# Patient Record
Sex: Female | Born: 1935 | Hispanic: No | Marital: Single | Smoking: Current every day smoker
Health system: Southern US, Community
[De-identification: ages and names within clinical notes are randomized; demographics above are authoritative.]

## PROBLEM LIST (undated history)

## (undated) DIAGNOSIS — I10 Essential (primary) hypertension: Secondary | ICD-10-CM

## (undated) DIAGNOSIS — I639 Cerebral infarction, unspecified: Secondary | ICD-10-CM

## (undated) HISTORY — PX: ABDOMINAL HYSTERECTOMY: SHX81

---

## 2018-11-27 ENCOUNTER — Observation Stay (HOSPITAL_COMMUNITY): Payer: Medicare Other

## 2018-11-27 ENCOUNTER — Emergency Department (HOSPITAL_COMMUNITY): Payer: Medicare Other

## 2018-11-27 ENCOUNTER — Inpatient Hospital Stay (HOSPITAL_COMMUNITY)
Admission: EM | Admit: 2018-11-27 | Discharge: 2018-11-29 | DRG: 071 | Disposition: A | Discharge status: Home Health | Payer: Medicare Other | Attending: Internal Medicine | Admitting: Internal Medicine

## 2018-11-27 ENCOUNTER — Encounter (HOSPITAL_COMMUNITY): Payer: Self-pay

## 2018-11-27 ENCOUNTER — Other Ambulatory Visit: Payer: Self-pay

## 2018-11-27 DIAGNOSIS — I480 Paroxysmal atrial fibrillation: Secondary | ICD-10-CM | POA: Diagnosis present

## 2018-11-27 DIAGNOSIS — R4182 Altered mental status, unspecified: Secondary | ICD-10-CM | POA: Diagnosis not present

## 2018-11-27 DIAGNOSIS — N179 Acute kidney failure, unspecified: Secondary | ICD-10-CM

## 2018-11-27 DIAGNOSIS — E785 Hyperlipidemia, unspecified: Secondary | ICD-10-CM

## 2018-11-27 DIAGNOSIS — F0150 Vascular dementia without behavioral disturbance: Secondary | ICD-10-CM | POA: Diagnosis present

## 2018-11-27 DIAGNOSIS — Z79899 Other long term (current) drug therapy: Secondary | ICD-10-CM

## 2018-11-27 DIAGNOSIS — E876 Hypokalemia: Secondary | ICD-10-CM | POA: Diagnosis present

## 2018-11-27 DIAGNOSIS — N183 Chronic kidney disease, stage 3 (moderate): Secondary | ICD-10-CM | POA: Diagnosis present

## 2018-11-27 DIAGNOSIS — Z7901 Long term (current) use of anticoagulants: Secondary | ICD-10-CM

## 2018-11-27 DIAGNOSIS — Z90710 Acquired absence of both cervix and uterus: Secondary | ICD-10-CM

## 2018-11-27 DIAGNOSIS — F17210 Nicotine dependence, cigarettes, uncomplicated: Secondary | ICD-10-CM | POA: Diagnosis present

## 2018-11-27 DIAGNOSIS — G9340 Encephalopathy, unspecified: Secondary | ICD-10-CM | POA: Diagnosis not present

## 2018-11-27 DIAGNOSIS — Z1159 Encounter for screening for other viral diseases: Secondary | ICD-10-CM

## 2018-11-27 DIAGNOSIS — I129 Hypertensive chronic kidney disease with stage 1 through stage 4 chronic kidney disease, or unspecified chronic kidney disease: Secondary | ICD-10-CM | POA: Diagnosis present

## 2018-11-27 HISTORY — DX: Essential (primary) hypertension: I10

## 2018-11-27 HISTORY — DX: Cerebral infarction, unspecified: I63.9

## 2018-11-27 LAB — URINALYSIS, ROUTINE W REFLEX MICROSCOPIC
Bilirubin Urine: NEGATIVE
Glucose, UA: NEGATIVE mg/dL
Ketones, ur: NEGATIVE mg/dL
Nitrite: NEGATIVE
Protein, ur: 100 mg/dL — AB
Specific Gravity, Urine: 1.016 (ref 1.005–1.030)
pH: 6 (ref 5.0–8.0)

## 2018-11-27 LAB — CBC
HCT: 37.3 % (ref 36.0–46.0)
Hemoglobin: 12.1 g/dL (ref 12.0–15.0)
MCH: 31.8 pg (ref 26.0–34.0)
MCHC: 32.4 g/dL (ref 30.0–36.0)
MCV: 97.9 fL (ref 80.0–100.0)
Platelets: 213 10*3/uL (ref 150–400)
RBC: 3.81 MIL/uL — ABNORMAL LOW (ref 3.87–5.11)
RDW: 15.2 % (ref 11.5–15.5)
WBC: 7.1 10*3/uL (ref 4.0–10.5)
nRBC: 0 % (ref 0.0–0.2)

## 2018-11-27 LAB — COMPREHENSIVE METABOLIC PANEL
ALT: 33 U/L (ref 0–44)
AST: 44 U/L — ABNORMAL HIGH (ref 15–41)
Albumin: 4 g/dL (ref 3.5–5.0)
Alkaline Phosphatase: 76 U/L (ref 38–126)
Anion gap: 8 (ref 5–15)
BUN: 32 mg/dL — ABNORMAL HIGH (ref 8–23)
CO2: 22 mmol/L (ref 22–32)
Calcium: 9 mg/dL (ref 8.9–10.3)
Chloride: 113 mmol/L — ABNORMAL HIGH (ref 98–111)
Creatinine, Ser: 2.2 mg/dL — ABNORMAL HIGH (ref 0.44–1.00)
GFR calc Af Amer: 24 mL/min — ABNORMAL LOW (ref >60)
GFR calc non Af Amer: 20 mL/min — ABNORMAL LOW (ref >60)
Glucose, Bld: 103 mg/dL — ABNORMAL HIGH (ref 70–99)
Potassium: 3.5 mmol/L (ref 3.5–5.1)
Sodium: 143 mmol/L (ref 135–145)
Total Bilirubin: 0.6 mg/dL (ref 0.3–1.2)
Total Protein: 8.1 g/dL (ref 6.5–8.1)

## 2018-11-27 LAB — SARS CORONAVIRUS 2 BY RT PCR (HOSPITAL ORDER, PERFORMED IN ~~LOC~~ HOSPITAL LAB): SARS Coronavirus 2: NEGATIVE

## 2018-11-27 LAB — PROTIME-INR
INR: 2.5 — ABNORMAL HIGH (ref 0.8–1.2)
Prothrombin Time: 26.9 seconds — ABNORMAL HIGH (ref 11.4–15.2)

## 2018-11-27 LAB — CBG MONITORING, ED: Glucose-Capillary: 87 mg/dL (ref 70–99)

## 2018-11-27 MED ORDER — SODIUM CHLORIDE 0.9% FLUSH: 3.0000 mL | Freq: Once | INTRAVENOUS | Status: DC

## 2018-11-27 MED ORDER — ONDANSETRON HCL 4 MG PO TABS: 4.0000 mg | ORAL_TABLET | Freq: Four times a day (QID) | ORAL | Status: DC | PRN

## 2018-11-27 MED ORDER — ROSUVASTATIN CALCIUM 20 MG PO TABS
40.0000 mg | ORAL_TABLET | Freq: Every day | ORAL | Status: DC
  Administered 2018-11-28 – 2018-11-29 (×2): 40 mg via ORAL
  Filled 2018-11-27 (×2): qty 2

## 2018-11-27 MED ORDER — ACETAMINOPHEN 650 MG RE SUPP: 650.0000 mg | Freq: Four times a day (QID) | RECTAL | Status: DC | PRN

## 2018-11-27 MED ORDER — SODIUM CHLORIDE 0.9 % IV SOLN
1.0000 g | INTRAVENOUS | Status: DC
  Administered 2018-11-27: 1 g via INTRAVENOUS
  Filled 2018-11-27: qty 10

## 2018-11-27 MED ORDER — SODIUM CHLORIDE 0.9 % IV SOLN
INTRAVENOUS | Status: DC
  Administered 2018-11-28 (×2): via INTRAVENOUS

## 2018-11-27 MED ORDER — ONDANSETRON HCL 4 MG/2ML IJ SOLN: 4.0000 mg | Freq: Four times a day (QID) | INTRAMUSCULAR | Status: DC | PRN

## 2018-11-27 MED ORDER — SODIUM CHLORIDE 0.9 % IV SOLN
1.0000 g | INTRAVENOUS | Status: DC
  Administered 2018-11-28: 1 g via INTRAVENOUS
  Filled 2018-11-27 (×2): qty 10

## 2018-11-27 MED ORDER — ACETAMINOPHEN 325 MG PO TABS: 650.0000 mg | ORAL_TABLET | Freq: Four times a day (QID) | ORAL | Status: DC | PRN

## 2018-11-27 NOTE — ED Triage Notes (Signed)
Pt brought in by daughter. Per daughter, mother has been more confused and hallucinating. Daughter describes this increasing the last 1-2 days. Pt is alert and oriented x 4 in triage. Pt states that she is here for a "neuro check". Daughter states pt has hx of TIAs. PCP told family to bring pt in for eval.

## 2018-11-27 NOTE — ED Notes (Signed)
ED TO INPATIENT HANDOFF REPORT  Name/Age/Gender Michelle Walton 84 y.o. female  Code Status   Home/SNF/Other Home  Chief Complaint ALTERED MENTAL STATUS  Level of Care/Admitting Diagnosis ED Disposition    ED Disposition Condition Comment   Admit  Hospital Area: [facility] [[id number]]  Level of Care: Telemetry [5]  Admit to tele based on following criteria: Other see comments  Comments: ?Stroke  Covid Evaluation: Screening Protocol (No Symptoms)  Diagnosis: Acute encephalopathy [462194]  Admitting Physician: Noralee Stain [[id number]]  Attending Physician: Noralee Stain [id number]  PT Class (Do Not Modify): Observation [104]  PT Acc Code (Do Not Modify): Observation [10022]       Medical History Past Medical History:  Diagnosis Date  . Hypertension   . Stroke [facility])     Allergies No Known Allergies  IV Location/Drains/Wounds Patient Lines/Drains/Airways Status   Active Line/Drains/Airways    Name:   Placement date:   Placement time:   Site:   Days:   Peripheral IV 11/27/18 Right Antecubital   11/27/18    1502    Antecubital   less than 1          Labs/Imaging Results for orders placed or performed during the hospital encounter of 11/27/18 (from the past 48 hour(s))  CBG monitoring, ED     Status: None   Collection Time: 11/27/18 12:57 PM  Result Value Ref Range   Glucose-Capillary 87 70 - 99 mg/dL  Urinalysis, Routine w reflex microscopic     Status: Abnormal   Collection Time: 11/27/18  1:53 PM  Result Value Ref Range   Color, Urine YELLOW YELLOW   APPearance HAZY (A) CLEAR   Specific Gravity, Urine 1.016 1.005 - 1.030   pH 6.0 5.0 - 8.0   Glucose, UA NEGATIVE NEGATIVE mg/dL   Hgb urine dipstick MODERATE (A) NEGATIVE   Bilirubin Urine NEGATIVE NEGATIVE   Ketones, ur NEGATIVE NEGATIVE mg/dL   Protein, ur 301 (A) NEGATIVE mg/dL   Nitrite NEGATIVE NEGATIVE   Leukocytes,Ua MODERATE (A) NEGATIVE   RBC / HPF 6-10 0 - 5 RBC/hpf   WBC, UA  21-50 0 - 5 WBC/hpf   Bacteria, UA MANY (A) NONE SEEN   Squamous Epithelial / LPF 0-5 0 - 5   Mucus PRESENT    Hyaline Casts, UA PRESENT    Granular Casts, UA PRESENT     Comment: Performed at [facility], [address], [place], [place] [address]  Comprehensive metabolic panel     Status: Abnormal   Collection Time: 11/27/18  2:04 PM  Result Value Ref Range   Sodium 143 135 - 145 mmol/L   Potassium 3.5 3.5 - 5.1 mmol/L   Chloride 113 (H) 98 - 111 mmol/L   CO2 22 22 - 32 mmol/L   Glucose, Bld 103 (H) 70 - 99 mg/dL   BUN 32 (H) 8 - 23 mg/dL   Creatinine, Ser 2.49 (H) 0.44 - 1.00 mg/dL   Calcium 9.0 8.9 - 32.4 mg/dL   Total Protein 8.1 6.5 - 8.1 g/dL   Albumin 4.0 3.5 - 5.0 g/dL   AST 44 (H) 15 - 41 U/L   ALT 33 0 - 44 U/L   Alkaline Phosphatase 76 38 - 126 U/L   Total Bilirubin 0.6 0.3 - 1.2 mg/dL   GFR calc non Af Amer 20 (L) >60 mL/min   GFR calc Af Amer 24 (L) >60 mL/min   Anion gap 8 5 - 15  Comment: Performed at [facility], [address] [place], [place] [address]  CBC     Status: Abnormal   Collection Time: 11/27/18  2:04 PM  Result Value Ref Range   WBC 7.1 4.0 - 10.5 K/uL   RBC 3.81 (L) 3.87 - 5.11 MIL/uL   Hemoglobin 12.1 12.0 - 15.0 g/dL   HCT 19.137.3 47.836.0 - 29.546.0 %   MCV 97.9 80.0 - 100.0 fL   MCH 31.8 26.0 - 34.0 pg   MCHC 32.4 30.0 - 36.0 g/dL   RDW 62.115.2 30.811.5 - 65.715.5 %   Platelets 213 150 - 400 K/uL   nRBC 0.0 0.0 - 0.2 %    Comment: Performed at [facility], [address] [place], [place] [address]  Protime-INR     Status: Abnormal   Collection Time: 11/27/18  3:39 PM  Result Value Ref Range   Prothrombin Time 26.9 (H) 11.4 - 15.2 seconds   INR 2.5 (H) 0.8 - 1.2    Comment: (NOTE) INR goal varies based on device and disease states. Performed at [facility], [address] [place], [place] [address]    Ct Head Wo Contrast  Addendum Date: 11/27/2018   ADDENDUM REPORT:  11/27/2018 14:07 ADDENDUM: These results were called by telephone at the time of interpretation on 11/27/2018 at 2:07 pm to Dr. Lorre NickANTHONY ALLEN , who verbally acknowledged these results. Electronically Signed   By: Bretta BangWilliam  Woodruff III M.D.   On: 11/27/2018 14:07   Result Date: 11/27/2018 CLINICAL DATA:  Confusion with hallucinations EXAM: CT HEAD WITHOUT CONTRAST TECHNIQUE: Contiguous axial images were obtained from the base of the skull through the vertex without intravenous contrast. COMPARISON:  None. FINDINGS: Brain: There is moderate diffuse atrophy. There is no intracranial mass, hemorrhage, extra-axial fluid collection, or midline shift. There is mild calcification along the anterior falx, benign in appearance. There is evidence of a prior mid left frontal infarct. There is extensive small vessel disease throughout the centra semiovale bilaterally. No acute infarct is demonstrable on this study. Vascular: There is increased attenuation in the left middle cerebral artery compared to its counterpart on the right. This finding may be indicative of the earliest changes of hyperdense vessel. Elsewhere there is extensive calcification in the distal vertebral arteries and carotid siphon regions bilaterally. Skull: The bony calvarium appears intact. Sinuses/Orbits: There is mucosal thickening in several ethmoid air cells. Other visualized paranasal sinuses are clear. Visualized orbits appear symmetric bilaterally. Other: Mastoid air cells are clear. IMPRESSION: Moderate diffuse atrophy with extensive periventricular small vessel disease bilaterally. Prior infarct mid left frontal lobe. No acute infarct is evident in the brain parenchyma. Note that there is increased attenuation in the left middle cerebral artery compared to the right. This is a finding that raises concern for potential early changes of acute middle cerebral artery distribution infarct on the left. Elsewhere, there is extensive arterial vascular  calcification in the distal vertebral artery and carotid siphon regions bilaterally. There is mucosal thickening in several ethmoid air cells. Electronically Signed: By: Bretta BangWilliam  Woodruff III M.D. On: 11/27/2018 13:50    Pending Labs Unresulted Labs (From admission, onward)    Start     Ordered   11/27/18 1540  SARS Coronavirus 2 (CEPHEID - Performed in [facility] hospital lab), [facility] Order  (Asymptomatic Patients Labs)  Once,   R    Question:  Rule Out  Answer:  Yes   11/27/18 1539   11/27/18 1327  Urine Culture  ONCE - STAT,   STAT     11/27/18 1326   Signed and Held  Protime-INR  Tomorrow morning,   R     Signed and Held   Signed and Held  CBC  Tomorrow morning,   R     Signed and Held   Signed and Held  Basic metabolic panel  Tomorrow morning,   R     Signed and Held          Vitals/Pain Today's Vitals   11/27/18 1530 11/27/18 1600 11/27/18 1630 11/27/18 1700  BP: (!) 171/82 (!) 167/74 (!) 151/73 (!) 155/78  Pulse: 80 76 72 78  Resp: (!) Temp:      TempSrc:      SpO2: 100% 100% 100% 100%  Weight:      Height:      PainSc:        Isolation Precautions No active isolations  Medications Medications  cefTRIAXone (ROCEPHIN) 1 g in sodium chloride 0.9 % 100 mL IVPB (0 g Intravenous Stopped 11/27/18 1540)    Mobility walks

## 2018-11-27 NOTE — ED Notes (Signed)
Patient transported to CT 

## 2018-11-27 NOTE — H&P (Signed)
History and Physical    Michelle Walton ZOX:[id number] DOB: 21-Jul-1934 DOA: 11/27/2018  PCP: System, Pcp Not In  Patient coming from: Home  Chief Complaint: Altered mental status   HPI: Michelle Walton is a 84 y.o. female with medical history significant of previous stroke, HTN, HLD who is visiting [place] from [place] for the past 2-3 weeks. She presents to the hospital with concern for altered mental status.  Apparently per report, daughter felt that patient was acting differently from her baseline.  Patient has some history of dementia and short-term memory loss at baseline but has been more confused in the past 2 days.  Per daughter, patient's baseline is that she is forgetful with her words and vocabulary. Now, she is not really aware of her surroundings, hallucinating, forgetting simple tasks, appears to be foggy. Patient does not have any complaints on examination.  She denies any fevers, cough, shortness of breath, chest pain, abdominal pain, nausea, vomiting or diarrhea.  She is somewhat confused, not oriented to place but could be that she was disoriented as she is not from this area.  She is oriented to year and herself.  She states that she has fallen recently as well.  ED Course: Labs obtained creatinine 2.2, INR 2.5. CT head without contrast revealed moderate diffuse atrophy, no acute infarct, although there was some increased attenuation in the left middle cerebral artery compared to the right with concern for potential early change in the acute MCA distribution infarct on the left.  Case discussed with neurology who recommended MRI for further evaluation.  Review of Systems: As per HPI otherwise 10 point review of systems negative.   Past Medical History:  Diagnosis Date   Hypertension    Stroke [facility])     Past Surgical History:  Procedure Laterality Date   ABDOMINAL HYSTERECTOMY       reports that she has been smoking. She has been smoking about 0.50 packs per day. She has  never used smokeless tobacco. She reports current alcohol use. She reports that she does not use drugs.  No Known Allergies  Family History  Family history unknown: Yes     Prior to Admission medications   Medication Sig Start Date End Date Taking? Authorizing Provider  Omega-3 Fatty Acids (FISH OIL) 1000 MG CAPS Take 1,000 mg by mouth daily.   Yes [provider]  quinapril (ACCUPRIL) 10 MG tablet Take 10 mg by mouth daily.   Yes [provider]  rosuvastatin (CRESTOR) 40 MG tablet Take 40 mg by mouth daily.   Yes [provider]  warfarin (COUMADIN) 3 MG tablet Take 3 mg by mouth daily.   Yes [provider]    Physical Exam: Vitals:   11/27/18 1241 11/27/18 1244 11/27/18 1430  BP:  128/85 (!) 150/69  Pulse:  90 74  Resp:  18 18  Temp:  98.5 F (36.9 C)   TempSrc:  Oral   SpO2:  96% 100%  Weight: 49.9 kg    Height:  (1.549 m)       Constitutional: NAD, calm, comfortable Eyes: PERRL, lids and conjunctivae normal ENMT: Mucous membranes are moist. Posterior pharynx clear of any exudate or lesions.Normal dentition.  Neck: normal, supple, no masses, no thyromegaly Respiratory: clear to auscultation bilaterally, no wheezing, no crackles. Normal respiratory effort. No accessory muscle use.  Cardiovascular: Regular rate and rhythm, no murmurs / rubs / gallops. No extremity edema.  Abdomen: no tenderness, no masses palpated. No hepatosplenomegaly. Bowel sounds  positive.  Musculoskeletal: no clubbing / cyanosis. No joint deformity upper and lower extremities. Good ROM, no contractures. Normal muscle tone.  Skin: no rashes, lesions, ulcers. No induration Neurologic: CN 2-12 grossly intact. Strength 5/5 in all 4. No pronator drift. Speech clear.  Psychiatric: Normal judgment and insight. Alert and oriented x 2. Normal mood. Seems slightly confused, word-finding difficulty.   Labs on Admission: I have personally reviewed following labs and  imaging studies  CBC: Recent Labs  Lab 11/27/18 1404  WBC 7.1  HGB 12.1  HCT 37.3  MCV 97.9  PLT 213   Basic Metabolic Panel: Recent Labs  Lab 11/27/18 1404  NA 143  K 3.5  CL 113*  CO2 22  GLUCOSE 103*  BUN 32*  CREATININE 2.20*  CALCIUM 9.0   GFR: Estimated Creatinine Clearance: 15.1 mL/min (A) (by C-G formula based on SCr of 2.2 mg/dL (H)). Liver Function Tests: Recent Labs  Lab 11/27/18 1404  AST 44*  ALT 33  ALKPHOS 76  BILITOT 0.6  PROT 8.1  ALBUMIN 4.0   No results for input(s): LIPASE, AMYLASE in the last 168 hours. No results for input(s): AMMONIA in the last 168 hours. Coagulation Profile: Recent Labs  Lab 11/27/18 1539  INR 2.5*   Cardiac Enzymes: No results for input(s): CKTOTAL, CKMB, CKMBINDEX, TROPONINI in the last 168 hours. BNP (last 3 results) No results for input(s): PROBNP in the last 8760 hours. HbA1C: No results for input(s): HGBA1C in the last 72 hours. CBG: Recent Labs  Lab 11/27/18 1257  GLUCAP 87   Lipid Profile: No results for input(s): CHOL, HDL, LDLCALC, TRIG, CHOLHDL, LDLDIRECT in the last 72 hours. Thyroid Function Tests: No results for input(s): TSH, T4TOTAL, FREET4, T3FREE, THYROIDAB in the last 72 hours. Anemia Panel: No results for input(s): VITAMINB12, FOLATE, FERRITIN, TIBC, IRON, RETICCTPCT in the last 72 hours. Urine analysis:    Component Value Date/Time   COLORURINE YELLOW 11/27/2018 1353   APPEARANCEUR HAZY (A) 11/27/2018 1353   LABSPEC 1.016 11/27/2018 1353   PHURINE 6.0 11/27/2018 1353   GLUCOSEU NEGATIVE 11/27/2018 1353   HGBUR MODERATE (A) 11/27/2018 1353   BILIRUBINUR NEGATIVE 11/27/2018 1353   KETONESUR NEGATIVE 11/27/2018 1353   PROTEINUR 100 (A) 11/27/2018 1353   NITRITE NEGATIVE 11/27/2018 1353   LEUKOCYTESUR MODERATE (A) 11/27/2018 1353   Sepsis Labs: !!!!!!!!!!!!!!!!!!!!!!!!!!!!!!!!!!!!!!!!!!!! @LABRCNTIP (procalcitonin:4,lacticidven:4) )No results found for this or any previous visit  (from the past 240 hour(s)).   Radiological Exams on Admission: Ct Head Wo Contrast  Addendum Date: 11/27/2018   ADDENDUM REPORT: 11/27/2018 14:07 ADDENDUM: These results were called by telephone at the time of interpretation on 11/27/2018 at 2:07 pm to Dr. Lorre NickANTHONY ALLEN , who verbally acknowledged these results. Electronically Signed   By: Bretta BangWilliam  Woodruff III M.D.   On: 11/27/2018 14:07   Result Date: 11/27/2018 CLINICAL DATA:  Confusion with hallucinations EXAM: CT HEAD WITHOUT CONTRAST TECHNIQUE: Contiguous axial images were obtained from the base of the skull through the vertex without intravenous contrast. COMPARISON:  None. FINDINGS: Brain: There is moderate diffuse atrophy. There is no intracranial mass, hemorrhage, extra-axial fluid collection, or midline shift. There is mild calcification along the anterior falx, benign in appearance. There is evidence of a prior mid left frontal infarct. There is extensive small vessel disease throughout the centra semiovale bilaterally. No acute infarct is demonstrable on this study. Vascular: There is increased attenuation in the left middle cerebral artery compared to its counterpart on the right. This finding may be indicative  of the earliest changes of hyperdense vessel. Elsewhere there is extensive calcification in the distal vertebral arteries and carotid siphon regions bilaterally. Skull: The bony calvarium appears intact. Sinuses/Orbits: There is mucosal thickening in several ethmoid air cells. Other visualized paranasal sinuses are clear. Visualized orbits appear symmetric bilaterally. Other: Mastoid air cells are clear. IMPRESSION: Moderate diffuse atrophy with extensive periventricular small vessel disease bilaterally. Prior infarct mid left frontal lobe. No acute infarct is evident in the brain parenchyma. Note that there is increased attenuation in the left middle cerebral artery compared to the right. This is a finding that raises concern for potential  early changes of acute middle cerebral artery distribution infarct on the left. Elsewhere, there is extensive arterial vascular calcification in the distal vertebral artery and carotid siphon regions bilaterally. There is mucosal thickening in several ethmoid air cells. Electronically Signed: By: Bretta Bang III M.D. On: 11/27/2018 13:50    EKG: Independently reviewed. Normal sinus rhythm rate 88  Assessment/Plan Principal Problem:   Acute encephalopathy Active Problems:   AKI (acute kidney injury) (HCC)   Hyperlipemia   On anticoagulant therapy   Acute encephalopathy, unclear etiology -Possible stroke, CT head revealed increased attenuation in left MCA compared to the right.  MRI brain ordered -Possibly due to UTI, present on admission. Patient has no symptoms of UTI, no dysuria.  UA revealed moderate leukocytes with many bacteria.  Urine culture is pending.  She was started on empiric Rocephin. -Patient does have some dementia and short-term memory loss at baseline  AKI versus CKD -No previous blood work on file, family does not know of any underlying kidney disease  -Presenting creatinine 2.2, will give IV hydration and hold lisinopril and monitor BMP -IVF   Hyperlipidemia -Continue Crestor  Anticoagulation -On coumadin, unclear why she is on this, family thinks it was started after a stroke and patient has been on this for many years   DVT prophylaxis: Coumadin Code Status: Full, discussed with daughter over the phone. Patient has 2 daughters who are next of kin.   Family Communication: Spoke with daughter over the phone Disposition Plan: Pending work up  [place] called: Neurology over the phone   Admission status: Observation   Severity of Illness: The appropriate patient status for this patient is OBSERVATION. Observation status is judged to be reasonable and necessary in order to provide the required intensity of service to ensure the patient's safety. The  patient's presenting symptoms, physical exam findings, and initial radiographic and laboratory data in the context of their medical condition is felt to place them at decreased risk for further clinical deterioration. Furthermore, it is anticipated that the patient will be medically stable for discharge from the hospital within 2 midnights of admission.   Noralee Stain, DO [facility] 11/27/2018, 4:40 PM    How to contact the [facility] Attending or Consulting provider 7A - 7P or covering provider during after hours 7P -7A, for this patient?  1. Check the care team in [facility] and look for a) attending/consulting [facility] provider listed and b) the [facility] team listed 2. Log into [email or web address] and use [place]'s universal password to access. If you do not have the password, please contact the hospital operator. 3. Locate the [facility] provider you are looking for under [facility] and page to a number that you can be directly reached. 4. If you still have difficulty reaching the provider, please page the [facility] (Director on Call) for the Hospitalists listed on amion for assistance.

## 2018-11-27 NOTE — Progress Notes (Signed)
ANTICOAGULATION CONSULT NOTE - Initial Consult  Pharmacy Consult for warfarin Indication: hx CVA  No Known Allergies  Patient Measurements: Height: 5\' 1"  (154.9 cm) Weight: 110 lb (49.9 kg) IBW/kg (Calculated) : 47.8  Vital Signs: Temp: 98.5 F (36.9 C) (06/05 1244) Temp Source: Oral (06/05 1244) BP: 155/78 (06/05 1700) Pulse Rate: 78 (06/05 1700)  Labs: Recent Labs    11/27/18 1404 11/27/18 1539  HGB 12.1  --   HCT 37.3  --   PLT 213  --   LABPROT  --  26.9*  INR  --  2.5*  CREATININE 2.20*  --     Estimated Creatinine Clearance: 15.1 mL/min (A) (by C-G formula based on SCr of 2.2 mg/dL (H)).   Medical History: Past Medical History:  Diagnosis Date  . Hypertension   . Stroke [facility])    Assessment: Patient admitted with AMS, acute encephalopathy and concern for stroke. Pt is PMH significant for CVA, HTN, HLD. She resides in [place] and is visiting the area so limited access to medical records to confirm warfarin dosing. Per MD note, patients family reports that she has been taking warfarin for many years and think it was started after a previous stroke.   Home dose: warfarin 3 mg PO daily Per med rec, patient reports last dose was 11/27/18 @ 0600  INR on admission: 2.5 - therapeutic  Today, 11/27/18  Hgb 12.1  Plt 213  INR 2.5 - therapeutic  Goal of Therapy:  INR 2-3 Monitor platelets by anticoagulation protocol: Yes   Plan:  Pt already took warfarin today. INR therapeutic. No dose ordered. INR/CBC with AM labs tomorrow  Cindi Carbon, PharmD 11/27/2018,5:14 PM

## 2018-11-27 NOTE — ED Notes (Signed)
Pt ambulatory to bathroom

## 2018-11-27 NOTE — ED Provider Notes (Addendum)
[facility]-EMERGENCY DEPT Provider Note   CSN: [id number] Arrival date & time: 11/27/18  1227    History   Chief Complaint Chief Complaint  Patient presents with  . Altered Mental Status    HPI Michelle Walton is a 84 y.o. female.     84 year old female presents with concern for altered mental status.  Patient was seen by her daughter who feels that she is acting differently.  The patient sounds denies any new somatic complaints.  No headaches, weakness in upper or lower extremities.  No recent fevers.  No cough or congestion.  No recent medication changes.  Denies any urinary symptoms.  Her physician was called and daughter instructed bring her here for further evaluation.     Past Medical History:  Diagnosis Date  . Hypertension   . Stroke [facility])     There are no active problems to display for this patient.   Past Surgical History:  Procedure Laterality Date  . ABDOMINAL HYSTERECTOMY       OB History   No obstetric history on file.      Home Medications    Prior to Admission medications   Not on File    Family History No family history on file.  Social History Social History   Tobacco Use  . Smoking status: Current Every Day Smoker    Packs/day: 0.50  . Smokeless tobacco: Never Used  Substance Use Topics  . Alcohol use: Yes    Comment: socially  . Drug use: Never     Allergies   Patient has no known allergies.   Review of Systems Review of Systems  All other systems reviewed and are negative.    Physical Exam Updated Vital Signs BP 128/85 (BP Location: Left Arm)   Pulse 90   Temp 98.5 F (36.9 C) (Oral)   Resp 18   Ht 1.549 m (5\' 1" )   Wt 49.9 kg   SpO2 96%   BMI 20.78 kg/m   Physical Exam Vitals signs and nursing note reviewed.  Constitutional:      General: She is not in acute distress.    Appearance: Normal appearance. She is well-developed. She is not toxic-appearing.  HENT:     Head: Normocephalic  and atraumatic.  Eyes:     General: Lids are normal.     Conjunctiva/sclera: Conjunctivae normal.     Pupils: Pupils are equal, round, and reactive to light.  Neck:     Musculoskeletal: Normal range of motion and neck supple.     Thyroid: No thyroid mass.     Trachea: No tracheal deviation.  Cardiovascular:     Rate and Rhythm: Normal rate and regular rhythm.     Heart sounds: Normal heart sounds. No murmur. No gallop.   Pulmonary:     Effort: Pulmonary effort is normal. No respiratory distress.     Breath sounds: Normal breath sounds. No stridor. No decreased breath sounds, wheezing, rhonchi or rales.  Abdominal:     General: Bowel sounds are normal. There is no distension.     Palpations: Abdomen is soft.     Tenderness: There is no abdominal tenderness. There is no rebound.  Musculoskeletal: Normal range of motion.        General: No tenderness.  Skin:    General: Skin is warm and dry.     Findings: No abrasion or rash.  Neurological:     Mental Status: She is alert and oriented to person, place, and  time.     GCS: GCS eye subscore is 4. GCS verbal subscore is 5. GCS motor subscore is 6.     Cranial Nerves: No cranial nerve deficit.     Sensory: No sensory deficit.     Motor: No weakness or tremor.     Coordination: Finger-Nose-Finger Test normal.  Psychiatric:        Mood and Affect: Affect is flat.        Speech: Speech normal.        Behavior: Behavior normal. Behavior is cooperative.      ED Treatments / Results  Labs (all labs ordered are listed, but only abnormal results are displayed) Labs Reviewed  URINE CULTURE  COMPREHENSIVE METABOLIC PANEL  CBC  URINALYSIS, ROUTINE W REFLEX MICROSCOPIC  CBG MONITORING, ED    EKG EKG Interpretation  Date/Time:  Friday November 27 2018 12:43:25 EDT Ventricular Rate:  88 PR Interval:    QRS Duration: 80 QT Interval:  310 QTC Calculation: 375 R Axis:   63 Text Interpretation:  Sinus rhythm Probable left atrial  enlargement Borderline repolarization abnormality Baseline wander in lead(s) II III aVF V2 No old tracing to compare Confirmed by Lorre NickAllen, Nasean Zapf ([id number]) on 11/27/2018 1:17:15 PM   Radiology No results found.  Procedures Procedures (including critical care time)  Medications Ordered in ED Medications  sodium chloride flush (NS) 0.9 % injection 3 mL (has no administration in time range)     Initial Impression / Assessment and Plan / ED Course  I have reviewed the triage vital signs and the nursing notes.  Pertinent labs & imaging results that were available during my care of the patient were reviewed by me and considered in my medical decision making (see chart for details).        Had a long discussion with patient's daughter who states that the patient has had history of dementia with some short-term memory issues but that the last 2 days she has been more confused.  She has had more weakness.  Urinalysis here is positive for infection.  Patient's chemistry panel shows a creatinine of 2.2.  No prior values are noted.  Possible dehydration as etiology.  Patient is on ACE inhibitor's could also play a role.  Patient started on IV Rocephin.  Head CT with possible infarct as noted.  Discussed this with neuro hospitalist who recommends MRI.  Will consult hospitalist for admission  Michelle HackerGloria Mettler was evaluated in Emergency Department on 11/27/2018 for the symptoms described in the history of present illness. She was evaluated in the context of the global COVID-19 pandemic, which necessitated consideration that the patient might be at risk for infection with the SARS-CoV-2 virus that causes COVID-19. Institutional protocols and algorithms that pertain to the evaluation of patients at risk for COVID-19 are in a state of rapid change based on information released by regulatory bodies including the CDC and federal and state organizations. These policies and algorithms were followed during the patient's care  in the ED.   Final Clinical Impressions(s) / ED Diagnoses   Final diagnoses:  None    ED Discharge Orders    None       Lorre NickAllen, Gamaliel Charney, MD 11/27/18 1540    Lorre NickAllen, Traeger Sultana, MD 11/27/18 1541

## 2018-11-28 ENCOUNTER — Encounter (HOSPITAL_COMMUNITY): Payer: Self-pay

## 2018-11-28 ENCOUNTER — Inpatient Hospital Stay (HOSPITAL_COMMUNITY): Payer: Medicare Other

## 2018-11-28 DIAGNOSIS — G9340 Encephalopathy, unspecified: Secondary | ICD-10-CM | POA: Diagnosis present

## 2018-11-28 DIAGNOSIS — N183 Chronic kidney disease, stage 3 (moderate): Secondary | ICD-10-CM | POA: Diagnosis present

## 2018-11-28 DIAGNOSIS — R4182 Altered mental status, unspecified: Secondary | ICD-10-CM | POA: Diagnosis present

## 2018-11-28 DIAGNOSIS — Z79899 Other long term (current) drug therapy: Secondary | ICD-10-CM | POA: Diagnosis not present

## 2018-11-28 DIAGNOSIS — Z7901 Long term (current) use of anticoagulants: Secondary | ICD-10-CM | POA: Diagnosis not present

## 2018-11-28 DIAGNOSIS — F17210 Nicotine dependence, cigarettes, uncomplicated: Secondary | ICD-10-CM | POA: Diagnosis present

## 2018-11-28 DIAGNOSIS — Z1159 Encounter for screening for other viral diseases: Secondary | ICD-10-CM | POA: Diagnosis not present

## 2018-11-28 DIAGNOSIS — I129 Hypertensive chronic kidney disease with stage 1 through stage 4 chronic kidney disease, or unspecified chronic kidney disease: Secondary | ICD-10-CM | POA: Diagnosis present

## 2018-11-28 DIAGNOSIS — Z90710 Acquired absence of both cervix and uterus: Secondary | ICD-10-CM | POA: Diagnosis not present

## 2018-11-28 DIAGNOSIS — N179 Acute kidney failure, unspecified: Secondary | ICD-10-CM | POA: Diagnosis present

## 2018-11-28 DIAGNOSIS — I480 Paroxysmal atrial fibrillation: Secondary | ICD-10-CM | POA: Diagnosis present

## 2018-11-28 DIAGNOSIS — E876 Hypokalemia: Secondary | ICD-10-CM | POA: Diagnosis present

## 2018-11-28 DIAGNOSIS — E785 Hyperlipidemia, unspecified: Secondary | ICD-10-CM | POA: Diagnosis present

## 2018-11-28 DIAGNOSIS — F0150 Vascular dementia without behavioral disturbance: Secondary | ICD-10-CM | POA: Diagnosis present

## 2018-11-28 LAB — CBC
HCT: 33.1 % — ABNORMAL LOW (ref 36.0–46.0)
Hemoglobin: 10.6 g/dL — ABNORMAL LOW (ref 12.0–15.0)
MCH: 30.8 pg (ref 26.0–34.0)
MCHC: 32 g/dL (ref 30.0–36.0)
MCV: 96.2 fL (ref 80.0–100.0)
Platelets: 187 10*3/uL (ref 150–400)
RBC: 3.44 MIL/uL — ABNORMAL LOW (ref 3.87–5.11)
RDW: 15.2 % (ref 11.5–15.5)
WBC: 6.8 10*3/uL (ref 4.0–10.5)
nRBC: 0 % (ref 0.0–0.2)

## 2018-11-28 LAB — BASIC METABOLIC PANEL
Anion gap: 4 — ABNORMAL LOW (ref 5–15)
BUN: 26 mg/dL — ABNORMAL HIGH (ref 8–23)
CO2: 21 mmol/L — ABNORMAL LOW (ref 22–32)
Calcium: 8.4 mg/dL — ABNORMAL LOW (ref 8.9–10.3)
Chloride: 118 mmol/L — ABNORMAL HIGH (ref 98–111)
Creatinine, Ser: 1.91 mg/dL — ABNORMAL HIGH (ref 0.44–1.00)
GFR calc Af Amer: 28 mL/min — ABNORMAL LOW (ref >60)
GFR calc non Af Amer: 24 mL/min — ABNORMAL LOW (ref >60)
Glucose, Bld: 77 mg/dL (ref 70–99)
Potassium: 3.3 mmol/L — ABNORMAL LOW (ref 3.5–5.1)
Sodium: 143 mmol/L (ref 135–145)

## 2018-11-28 LAB — URINE CULTURE: Culture: NO GROWTH

## 2018-11-28 LAB — PROTIME-INR
INR: 2.8 — ABNORMAL HIGH (ref 0.8–1.2)
Prothrombin Time: 28.9 seconds — ABNORMAL HIGH (ref 11.4–15.2)

## 2018-11-28 MED ORDER — WARFARIN - PHARMACIST DOSING INPATIENT: Freq: Every day | Status: DC

## 2018-11-28 MED ORDER — POTASSIUM CHLORIDE CRYS ER 20 MEQ PO TBCR
40.0000 meq | EXTENDED_RELEASE_TABLET | Freq: Once | ORAL | Status: AC
  Administered 2018-11-28: 40 meq via ORAL
  Filled 2018-11-28: qty 2

## 2018-11-28 MED ORDER — WARFARIN SODIUM 3 MG PO TABS
3.0000 mg | ORAL_TABLET | Freq: Every day | ORAL | Status: DC
  Filled 2018-11-28 (×2): qty 1

## 2018-11-28 NOTE — Progress Notes (Addendum)
PROGRESS NOTE    Michelle Walton  HKV:[id number] DOB: 1934-09-22 DOA: 11/27/2018 PCP: System, Pcp Not In     Brief Narrative:  Michelle Walton is a 84 y.o. female with medical history significant of previous other nonhemorrhagic stroke, HTN, HLD who is visiting [place] from [place] for the past 2-3 weeks. She presents to the hospital with concern for altered mental status.  Apparently per report, daughter felt that patient was acting differently from her baseline.  Patient has some history of dementia and short-term memory loss at baseline but has been more confused in the past 2 days.  Per daughter, patient's baseline is that she is forgetful with her words and vocabulary. Now, she is not really aware of her surroundings, hallucinating, forgetting simple tasks, appears to be foggy. Patient does not have any complaints on examination.  She denies any fevers, cough, shortness of breath, chest pain, abdominal pain, nausea, vomiting or diarrhea.  She is somewhat confused, not oriented to place but could be that she was disoriented as she is not from this area.  She is oriented to year and herself.  She states that she has fallen recently as well. Labs obtained creatinine 2.2, INR 2.5. CT head without contrast revealed moderate diffuse atrophy, no acute infarct, although there was some increased attenuation in the left middle cerebral artery compared to the right with concern for potential early change in the acute MCA distribution infarct on the left.  Case discussed with neurology who recommended MRI for further evaluation.  New events last 24 hours / Subjective: Patient sitting in bed, eating breakfast without any complaints.  No acute acute events noted overnight.  Assessment & Plan:   Principal Problem:   Acute encephalopathy Active Problems:   AKI (acute kidney injury) ([place])   Hyperlipemia   On anticoagulant therapy   Acute encephalopathy, other, unclear etiology -Possible stroke, CT head  revealed increased attenuation in left MCA compared to the right.  MRI brain ordered -Possibly due to UTI, present on admission. Patient has no symptoms of UTI, no dysuria.  UA revealed moderate leukocytes with many bacteria.  Urine culture is pending.  She was started on empiric Rocephin. -Patient does have some dementia and short-term memory loss at baseline -PT OT SLP  AKI versus CKD -No previous blood work on file, family does not know of any underlying kidney disease  -Presenting creatinine 2.2 -IVF  -Creatinine improved to 1.91, continue to monitor BMP on IV fluids  Hyperlipidemia -Continue Crestor  Anticoagulation, ?PAF -On coumadin, unclear why she is on this, family thinks it was started after a stroke and patient has been on this for many years -INR 2.8  Hypokalemia -Replace, trend    DVT prophylaxis: Coumadin Code Status: Full code Family Communication: Spoke with daughter over the phone Disposition Plan: Pending MRI   Consultants:   Neurology over the phone only  Procedures:   None   Antimicrobials:  Anti-infectives (From admission, onward)   Start     Dose/Rate Route Frequency Ordered Stop   11/28/18 1500  cefTRIAXone (ROCEPHIN) 1 g in sodium chloride 0.9 % 100 mL IVPB     1 g 200 mL/hr over 30 Minutes Intravenous Every 24 hours 11/27/18 1754     11/27/18 1500  cefTRIAXone (ROCEPHIN) 1 g in sodium chloride 0.9 % 100 mL IVPB  Status:  Discontinued     1 g 200 mL/hr over 30 Minutes Intravenous Every 24 hours 11/27/18 1446 11/27/18 1756  Objective: Vitals:   11/27/18 1700 11/27/18 1747 11/27/18 2053 11/28/18 0539  BP: (!) 155/78 (!) 160/77 (!) 146/70 116/69  Pulse: 78 89 72 77  Resp: 15 16 18 17   Temp:  98.4 F (36.9 C) 98 F (36.7 C) 98.7 F (37.1 C)  TempSrc:  Oral Oral Oral  SpO2: 100% 100% 100% 98%  Weight:      Height:        Intake/Output Summary (Last 24 hours) at 11/28/2018 1033 Last data filed at 11/28/2018 0600 Gross per 24  hour  Intake 693.17 ml  Output -  Net 693.17 ml   Filed Weights   11/27/18 1241  Weight: 49.9 kg    Examination:  General exam: Appears calm and comfortable  Respiratory system: Clear to auscultation. Respiratory effort normal. Cardiovascular system: S1 & S2 heard, irreg rhythm, +murmur Gastrointestinal system: Abdomen is nondistended, soft and nontender. No organomegaly or masses felt. Normal bowel sounds heard. Central nervous system: Alert and oriented. No focal neurological deficits. Extremities: Symmetric 5 x 5 power. Skin: No rashes, lesions or ulcers Psychiatry: Judgement and insight appear stable  Data Reviewed: I have personally reviewed following labs and imaging studies  CBC: Recent Labs  Lab 11/27/18 1404 11/28/18 0554  WBC 7.1 6.8  HGB 12.1 10.6*  HCT 37.3 33.1*  MCV 97.9 96.2  PLT 213 187   Basic Metabolic Panel: Recent Labs  Lab 11/27/18 1404 11/28/18 0554  NA 143 143  K 3.5 3.3*  CL 113* 118*  CO2 22 21*  GLUCOSE 103* 77  BUN 32* 26*  CREATININE 2.20* 1.91*  CALCIUM 9.0 8.4*   GFR: Estimated Creatinine Clearance: 17.4 mL/min (A) (by C-G formula based on SCr of 1.91 mg/dL (H)). Liver Function Tests: Recent Labs  Lab 11/27/18 1404  AST 44*  ALT 33  ALKPHOS 76  BILITOT 0.6  PROT 8.1  ALBUMIN 4.0   No results for input(s): LIPASE, AMYLASE in the last 168 hours. No results for input(s): AMMONIA in the last 168 hours. Coagulation Profile: Recent Labs  Lab 11/27/18 1539 11/28/18 0554  INR 2.5* 2.8*   Cardiac Enzymes: No results for input(s): CKTOTAL, CKMB, CKMBINDEX, TROPONINI in the last 168 hours. BNP (last 3 results) No results for input(s): PROBNP in the last 8760 hours. HbA1C: No results for input(s): HGBA1C in the last 72 hours. CBG: Recent Labs  Lab 11/27/18 1257  GLUCAP 87   Lipid Profile: No results for input(s): CHOL, HDL, LDLCALC, TRIG, CHOLHDL, LDLDIRECT in the last 72 hours. Thyroid Function Tests: No results  for input(s): TSH, T4TOTAL, FREET4, T3FREE, THYROIDAB in the last 72 hours. Anemia Panel: No results for input(s): VITAMINB12, FOLATE, FERRITIN, TIBC, IRON, RETICCTPCT in the last 72 hours. Sepsis Labs: No results for input(s): PROCALCITON, LATICACIDVEN in the last 168 hours.  Recent Results (from the past 240 hour(s))  SARS Coronavirus 2 (CEPHEID - Performed in [facility] hospital lab), Hosp Order     Status: None   Collection Time: 11/27/18  3:40 PM  Result Value Ref Range Status   SARS Coronavirus 2 NEGATIVE NEGATIVE Final    Comment: (NOTE) If result is NEGATIVE SARS-CoV-2 target nucleic acids are NOT DETECTED. The SARS-CoV-2 RNA is generally detectable in upper and lower  respiratory specimens during the acute phase of infection. The lowest  concentration of SARS-CoV-2 viral copies this assay can detect is 250  copies / mL. A negative result does not preclude SARS-CoV-2 infection  and should not be used as the sole  basis for treatment or other  patient management decisions.  A negative result may occur with  improper specimen collection / handling, submission of specimen other  than nasopharyngeal swab, presence of viral mutation(s) within the  areas targeted by this assay, and inadequate number of viral copies  (<250 copies / mL). A negative result must be combined with clinical  observations, patient history, and epidemiological information. If result is POSITIVE SARS-CoV-2 target nucleic acids are DETECTED. The SARS-CoV-2 RNA is generally detectable in upper and lower  respiratory specimens dur ing the acute phase of infection.  Positive  results are indicative of active infection with SARS-CoV-2.  Clinical  correlation with patient history and other diagnostic information is  necessary to determine patient infection status.  Positive results do  not rule out bacterial infection or co-infection with other viruses. If result is PRESUMPTIVE POSTIVE SARS-CoV-2 nucleic acids  MAY BE PRESENT.   A presumptive positive result was obtained on the submitted specimen  and confirmed on repeat testing.  While 2019 novel coronavirus  (SARS-CoV-2) nucleic acids may be present in the submitted sample  additional confirmatory testing may be necessary for epidemiological  and / or clinical management purposes  to differentiate between  SARS-CoV-2 and other Sarbecovirus currently known to infect humans.  If clinically indicated additional testing with an alternate test  methodology [phone]([id number]) is advised. The SARS-CoV-2 RNA is generally  detectable in upper and lower respiratory sp ecimens during the acute  phase of infection. The expected result is Negative. Fact Sheet for Patients:  BoilerBrush.com.cyhttps://www.fda.gov/media/136312/download Fact Sheet for Healthcare Providers: https://pope.com/https://www.fda.gov/media/136313/download This test is not yet approved or cleared by the [place] FDA and has been authorized for detection and/or diagnosis of SARS-CoV-2 by FDA under an Emergency Use Authorization (EUA).  This EUA will remain in effect (meaning this test can be used) for the duration of the COVID-19 declaration under Section 564(b)(1) of the Act, 21 U.S.C. section 360bbb-3(b)(1), unless the authorization is terminated or revoked sooner. Performed at [facility], [address] [place], [place] [address]        Radiology Studies: Ct Head Wo Contrast  Addendum Date: 11/27/2018   ADDENDUM REPORT: 11/27/2018 14:07 ADDENDUM: These results were called by telephone at the time of interpretation on 11/27/2018 at 2:07 pm to Dr. Lorre NickANTHONY ALLEN , who verbally acknowledged these results. Electronically Signed   By: Bretta BangWilliam  Woodruff III M.D.   On: 11/27/2018 14:07   Result Date: 11/27/2018 CLINICAL DATA:  Confusion with hallucinations EXAM: CT HEAD WITHOUT CONTRAST TECHNIQUE: Contiguous axial images were obtained from the base of the skull through the vertex without intravenous  contrast. COMPARISON:  None. FINDINGS: Brain: There is moderate diffuse atrophy. There is no intracranial mass, hemorrhage, extra-axial fluid collection, or midline shift. There is mild calcification along the anterior falx, benign in appearance. There is evidence of a prior mid left frontal infarct. There is extensive small vessel disease throughout the centra semiovale bilaterally. No acute infarct is demonstrable on this study. Vascular: There is increased attenuation in the left middle cerebral artery compared to its counterpart on the right. This finding may be indicative of the earliest changes of hyperdense vessel. Elsewhere there is extensive calcification in the distal vertebral arteries and carotid siphon regions bilaterally. Skull: The bony calvarium appears intact. Sinuses/Orbits: There is mucosal thickening in several ethmoid air cells. Other visualized paranasal sinuses are clear. Visualized orbits appear symmetric bilaterally. Other: Mastoid air cells are clear. IMPRESSION: Moderate diffuse atrophy with extensive  periventricular small vessel disease bilaterally. Prior infarct mid left frontal lobe. No acute infarct is evident in the brain parenchyma. Note that there is increased attenuation in the left middle cerebral artery compared to the right. This is a finding that raises concern for potential early changes of acute middle cerebral artery distribution infarct on the left. Elsewhere, there is extensive arterial vascular calcification in the distal vertebral artery and carotid siphon regions bilaterally. There is mucosal thickening in several ethmoid air cells. Electronically Signed: By: Bretta BangWilliam  Woodruff III M.D. On: 11/27/2018 13:50      Scheduled Meds: . rosuvastatin  40 mg Oral Daily   Continuous Infusions: . sodium chloride 75 mL/hr at 11/28/18 0600  . cefTRIAXone (ROCEPHIN)  IV       LOS: 0 days    Time spent: 25 minutes   Noralee StainJennifer Enrique Weiss, DO [facility] [email or web address]  11/28/2018, 10:33 AM

## 2018-11-28 NOTE — Evaluation (Signed)
Physical Therapy Evaluation Patient Details Name: Michelle Walton MRN: [id number] DOB: Aug 25, 1934 Today's Date: 11/28/2018   History of Present Illness   84 y.o. female with medical history significant of previous stroke, HTN, HLD who is [place] from [place] for the past 2[place]3 weeks. She presents to the hospital with concern for altered mental status.  Apparently per report, daughter felt that patient was acting differently from her baseline.  Patient has some history of dementia and short-term memory loss at baseline but has been more confused in the past 2 days  Clinical Impression  On eval, pt was Min guard-Min assist for mobility. She walked ~225 feet around the unit without an assistive device. Pt is unsteady at times. She remains confused-unsure of pt's baseline. Recommend 24 hour supervision/assist when pt returns home. Will continue to follow.    Follow Up Recommendations Home health PT;Supervision/Assistance - 24 hour    Equipment Recommendations  None recommended by PT    Recommendations for Other Services       Precautions / Restrictions Precautions Precautions: Fall Restrictions Weight Bearing Restrictions: No      Mobility  Bed Mobility Overal bed mobility: Needs Assistance Bed Mobility: Supine to Sit     Supine to sit: Supervision;Min guard     General bed mobility comments: oob in recliner  Transfers Overall transfer level: Needs assistance Equipment used: None Transfers: Sit to/from Stand Sit to Stand: Min guard         General transfer comment: close guard for safety.   Ambulation/Gait Ambulation/Gait assistance: Min guard;Min assist Gait Distance (Feet): 225 Feet Assistive device: None Gait Pattern/deviations: Step-through pattern     General Gait Details: fair gait speed. Intermittent unsteadiness requiring small amount of assistance from theapist to stabilize. Pt tolerated distance well.   Stairs            Wheelchair  Mobility    Modified Rankin (Stroke Patients Only)       Balance Overall balance assessment: Needs assistance           Standing balance-Leahy Scale: Fair                               Pertinent Vitals/Pain Pain Assessment: No/denies pain    Home Living Family/patient expects to be discharged to:: Private residence Living Arrangements: Alone Available Help at Discharge: (unsure-per chart, pt is visiting daughter)             Additional Comments: pt is poor historian on home set up, states she lives alone with grandson "on top of her" insinuating an apartment, but pt unable to provide further detail    Prior Function Level of Independence: Independent         Comments: pt does not use DME, states she normally lives and functions alone (unsure of reliability)     Hand Dominance        Extremity/Trunk Assessment   Upper Extremity Assessment Upper Extremity Assessment: Defer to OT evaluation    Lower Extremity Assessment Lower Extremity Assessment: Generalized weakness    Cervical / Trunk Assessment Cervical / Trunk Assessment: Kyphotic  Communication   Communication: No difficulties  Cognition Arousal/Alertness: Awake/alert Behavior During Therapy: WFL for tasks assessed/performed Overall Cognitive Status: No family/caregiver present to determine baseline cognitive functioning  General Comments: per chart review pt has dementia hx at baseline, but unsure of her usual status. Pt unable to recall year, situation, place- but oriented to self and was off on day by one. Pt also very poor historian      General Comments      Exercises     Assessment/Plan    PT Assessment Patient needs continued PT services  PT Problem List Decreased balance;Decreased mobility;Decreased cognition;Decreased safety awareness       PT Treatment Interventions Gait training;Functional mobility  training;Therapeutic activities;Balance training;Patient/family education;DME instruction;Therapeutic exercise    PT Goals (Current goals can be found in the Care Plan section)  Acute Rehab PT Goals Patient Stated Goal: to go home soon PT Goal Formulation: With patient Time For Goal Achievement: 12/12/18 Potential to Achieve Goals: Good    Frequency Min 3X/week   Barriers to discharge        Co-evaluation               AM-PAC PT "6 Clicks" Mobility  Outcome Measure Help needed turning from your back to your side while in a flat bed without using bedrails?: A Little Help needed moving from lying on your back to sitting on the side of a flat bed without using bedrails?: A Little Help needed moving to and from a bed to a chair (including a wheelchair)?: A Little Help needed standing up from a chair using your arms (e.g., wheelchair or bedside chair)?: A Little Help needed to walk in hospital room?: A Little Help needed climbing 3-5 steps with a railing? : A Little 6 Click Score: 18    End of Session Equipment Utilized During Treatment: Gait belt Activity Tolerance: Patient tolerated treatment well Patient left: in chair;with chair alarm set;with call bell/phone within reach   PT Visit Diagnosis: Unsteadiness on feet (R26.81);History of falling (Z91.81)    Time: 0355-9741 PT Time Calculation (min) (ACUTE ONLY): 8 min   Charges:   PT Evaluation $PT Eval Moderate Complexity: [place], PT Acute Rehabilitation Services Pager: [phone] Office: [phone]

## 2018-11-28 NOTE — Progress Notes (Signed)
[place] for warfarin Indication: hx CVA  No Known Allergies  Patient Measurements: Height: 5\' 1"  (154.9 cm) Weight: 110 lb (49.9 kg) IBW/kg (Calculated) : 47.8  Vital Signs: Temp: 98.7 F (37.1 C) (06/06 0539) Temp Source: Oral (06/06 0539) BP: 116/69 (06/06 0539) Pulse Rate: 77 (06/06 0539)  Labs: Recent Labs    11/27/18 1404 11/27/18 1539 11/28/18 0554  HGB 12.1  --  10.6*  HCT 37.3  --  33.1*  PLT 213  --  187  LABPROT  --  26.9* 28.9*  INR  --  2.5* 2.8*  CREATININE 2.20*  --  1.91*    Estimated Creatinine Clearance: 17.4 mL/min (A) (by C-G formula based on SCr of 1.91 mg/dL (H)).   Medical History: Past Medical History:  Diagnosis Date  . Hypertension   . Stroke [facility])    Assessment: Patient admitted with AMS, acute encephalopathy and concern for stroke. Pt is PMH significant for CVA, HTN, HLD. She resides in [place] and is visiting the area so limited access to medical records to confirm warfarin dosing. Per MD note, patients family reports that she has been taking warfarin for many years and think it was started after a previous stroke.   Home dose: warfarin 3 mg PO daily Per med rec, patient reports last dose was 11/27/18 @ 0600  INR on admission: 2.5 - therapeutic  Today, 11/28/18  Hgb 12.1 >>10.6  Plt 213 > 187, no bleeding reported  INR 2.8 - therapeutic  Goal of Therapy:  INR 2-3 Monitor platelets by anticoagulation protocol: Yes   Plan:  Continue home dose coumadin 3 mg daily INR ordered x 1 for tomorrow by MD  Eudelia Bunch, Pharm.D [phone] 11/28/2018 12:18 PM

## 2018-11-28 NOTE — Evaluation (Addendum)
Occupational Therapy Evaluation Patient Details Name: Michelle Walton Walton MRN: [id number] DOB: December 25, 1934 Today's Date: 11/28/2018    History of Present Illness : Michelle Walton is a 84 y.o. female with medical history significant of previous stroke, HTN, HLD who is visiting [place] from [place] for the past 2-3 weeks. She presents to the hospital with concern for altered mental status.  Apparently per report, daughter felt that patient was acting differently from her baseline.  Patient has some history of dementia and short-term memory loss at baseline but has been more confused in the past 2 days   Clinical Impression   Pt admitted with above diagnoses, with altered mental status and cognitive complications limiting ability to engage in BADL at desired level of ind. At time of evaluation, pt is very pleasant and willing to engage in functional mobility. She has a Comptrollersitter in the room due to impulsivity and lack of safety awareness. She is aware of her increased difficulty with memory. Pt oriented to self only at time of evaluation. She is able to say the date (off by a day), but unable to recall the year. She is a poor historian regarding her PLOF and travels from [place] to [place]. She is unable to recall city and situation regarding hospital. In chart review, dtr mentioned but pt only mentioned grandsons. She also was having word finding difficulty throughout eval. Pt stood at sink to complete grooming tasks at min guard-supervision level. Pt appears to be mostly limited due to cognitive deficits and lack of safety awareness in BADL tasks, raising bigger safety concern for pt living alone completing IADL tasks (cooking, etc.). Recommend no OT follow up, but 24 hour assistance for pt (if family can provide). If not recommend pt receive 24 hour facility placement due to cognitive concerns and safety management with her daily tasks. OT will continue to follow while acute per POC.    Follow Up Recommendations  No OT  follow up;Supervision/Assistance - 24 hour    Equipment Recommendations  None recommended by OT    Recommendations for Other Services       Precautions / Restrictions Precautions Precautions: Fall Restrictions Weight Bearing Restrictions: No      Mobility Bed Mobility Overal bed mobility: Needs Assistance Bed Mobility: Supine to Sit     Supine to sit: Supervision;Min guard     General bed mobility comments: close supervision/min guard for safety. Pt will impulsively get out of bed without bed rail removal, etc  Transfers Overall transfer level: Needs assistance Equipment used: None Transfers: Sit to/from Stand Sit to Stand: Supervision;Min guard         General transfer comment: close for safety, cues for safety and sequencing    Balance Overall balance assessment: Mild deficits observed, not formally tested;History of Falls(history of fall per chart review)                                         ADL either performed or assessed with clinical judgement   ADL Overall ADL's : Needs assistance/impaired Eating/Feeding: Set up;Sitting   Grooming: Min guard;Standing;Wash/dry face;Oral care Grooming Details (indicate cue type and reason): at sink in bathroom Upper Body Bathing: Min guard;Sitting;Standing   Lower Body Bathing: Min guard;Sit to/from stand;Sitting/lateral leans   Upper Body Dressing : Set up;Sitting;Standing   Lower Body Dressing: Set up;Sit to/from stand;Sitting/lateral leans Lower Body Dressing Details (indicate cue type and reason):  able to don/doff socks once set up in chair Toilet Transfer: Supervision/safety Toilet Transfer Details (indicate cue type and reason): simulated with recliner chair Toileting- Clothing Manipulation and Hygiene: Modified independent   Tub/ Shower Transfer: Min guard   Functional mobility during ADLs: Min guard;Supervision/safety General ADL Comments: pt completed sink level ADL, completing  functional mobility at min guard-supervision level for safety. Impacted mostly by cognitive processing of ADLs     Vision Baseline Vision/History: Wears glasses Wears Glasses: At all times Patient Visual Report: No change from baseline       Perception     Praxis      Pertinent Vitals/Pain Pain Assessment: No/denies pain     Hand Dominance     Extremity/Trunk Assessment Upper Extremity Assessment Upper Extremity Assessment: Overall WFL for tasks assessed(age expected general weakness)   Lower Extremity Assessment Lower Extremity Assessment: Overall WFL for tasks assessed       Communication Communication Communication: No difficulties   Cognition Arousal/Alertness: Awake/alert Behavior During Therapy: WFL for tasks assessed/performed Overall Cognitive Status: No family/caregiver present to determine baseline cognitive functioning                                 General Comments: per chart review pt has dementia hx at baseline, but unsure of her usual status. Pt unable to recall year, situation, place- but oriented to self and was off on day by one. Pt also very poor historian, unable to provide clear picture of [place] travels   General Comments       Exercises     Shoulder Instructions      Home Living Family/patient expects to be discharged to:: Private residence Living Arrangements: Alone Available Help at Discharge: Family(pt poor historian, mentions grandsons)                             Additional Comments: pt is poor historian on home set up, states she lives alone with grandson "on top of her" insinuating an apartment, but pt unable to provide further detail      Prior Functioning/Environment Level of Independence: Independent        Comments: pt does not use DME, states she normally lives and functions alone (unsure of truthfulness of this without collateral, but pt functional mobility is at min guard level)         OT Problem List: Decreased cognition;Decreased safety awareness;Decreased knowledge of precautions;Impaired balance (sitting and/or standing);Decreased activity tolerance      OT Treatment/Interventions: Self-care/ADL training;DME and/or AE instruction;Therapeutic activities;Balance training;Therapeutic exercise;Cognitive remediation/compensation;Neuromuscular education;Patient/family education    OT Goals(Current goals can be found in the care plan section) Acute Rehab OT Goals Patient Stated Goal: to go home soon OT Goal Formulation: With patient Time For Goal Achievement: 12/12/18 Potential to Achieve Goals: Good  OT Frequency: Min 2X/week   Barriers to D/C:            Co-evaluation              AM-PAC OT "6 Clicks" Daily Activity     Outcome Measure Help from another person eating meals?: None Help from another person taking care of personal grooming?: A Little Help from another person toileting, which includes using toliet, bedpan, or urinal?: None Help from another person bathing (including washing, rinsing, drying)?: A Little Help from another person to put on and taking off  regular upper body clothing?: None Help from another person to put on and taking off regular lower body clothing?: None 6 Click Score: 22   End of Session Nurse Communication: Mobility status(to sitter)  Activity Tolerance: Patient tolerated treatment well Patient left: in chair;with chair alarm set;with nursing/sitter in room;with call bell/phone within reach  OT Visit Diagnosis: Other symptoms and signs involving cognitive function;History of falling (Z91.81)                Time: 5732-2025 OT Time Calculation (min): 20 min Charges:  OT General Charges $OT Visit: 1 Visit OT Evaluation $OT Eval Low Complexity: 1 Low OT Treatments $Self Care/Home Management : 8-22 mins  Zenovia Jarred, MSOT, OTR/L Behavioral Health OT/ Acute Relief OT [facility] Office: [phone]  Zenovia Jarred 11/28/2018,  2:59 PM

## 2018-11-29 LAB — BASIC METABOLIC PANEL
Anion gap: 7 (ref 5–15)
BUN: 17 mg/dL (ref 8–23)
CO2: 17 mmol/L — ABNORMAL LOW (ref 22–32)
Calcium: 8.4 mg/dL — ABNORMAL LOW (ref 8.9–10.3)
Chloride: 118 mmol/L — ABNORMAL HIGH (ref 98–111)
Creatinine, Ser: 1.57 mg/dL — ABNORMAL HIGH (ref 0.44–1.00)
GFR calc Af Amer: 35 mL/min — ABNORMAL LOW (ref >60)
GFR calc non Af Amer: 31 mL/min — ABNORMAL LOW (ref >60)
Glucose, Bld: 86 mg/dL (ref 70–99)
Potassium: 3.9 mmol/L (ref 3.5–5.1)
Sodium: 142 mmol/L (ref 135–145)

## 2018-11-29 LAB — PROTIME-INR
INR: 2.1 — ABNORMAL HIGH (ref 0.8–1.2)
Prothrombin Time: 23.6 seconds — ABNORMAL HIGH (ref 11.4–15.2)

## 2018-11-29 NOTE — TOC Initial Note (Signed)
Transition of Care [facility](TOC) - Initial/Assessment Note    Patient Details  Name: Michelle Walton MRN: [id number] Date of Birth: 06-Nov-1934  Transition of Care (TOC) CM/SW Contact:    Armanda Heritageorres, Khamarion Bjelland Malkina, RN Phone Number: 11/29/2018, 10:40 AM  Clinical Narrative:    CM spoke with patient and patient's daughter regarding home health needs. Patient reports she is from [place] and anticipates returning there.  Per pt she sees Dr Tona SensingStanley Short for primary care in [place] [phone]). Patient selects [facility] for home health services. [facility] does have an agency in [place] and can transfer services when patient returns. [facility] rep given referral information. Patient states she does not have any DME needs.                 Expected Discharge Plan: Home w Home Health Services Barriers to Discharge: No Barriers Identified   Patient Goals and CMS Choice Patient states their goals for this hospitalization and ongoing recovery are:: to go home CMS Medicare.gov Compare Post Acute Care list provided to:: Patient Choice offered to / list presented to : Patient  Expected Discharge Plan and Services Expected Discharge Plan: Home w Home Health Services   Discharge Planning Services: CM Consult Post Acute Care Choice: Home Health Living arrangements for the past 2 months: Single Family Home Expected Discharge Date: 11/29/18               DME Arranged: N/A DME Agency: NA       HH Arranged: PT, RN HH [facility]: 11/29/18 Time HH Agency Contacted: 1039 [facility] Agency: Wayne Bothorey Barnett   Prior Living Arrangements/Services Living arrangements for the past 2 months: Single Family Home Lives with:: Self Patient language and need for interpreter reviewed:: Yes Do you feel safe going back to the place where you live?: Yes      Need for Family Participation in Patient Care: Yes (Comment) Care giver support system in place?: Yes  (comment)   Criminal Activity/Legal Involvement Pertinent to Current Situation/Hospitalization: No - Comment as needed  Activities of Daily Living Home Assistive Devices/Equipment: None ADL Screening (condition at time of admission) Patient's cognitive ability adequate to safely complete daily activities?: Yes Is the patient deaf or have difficulty hearing?: No Does the patient have difficulty seeing, even when wearing glasses/contacts?: No Does the patient have difficulty concentrating, remembering, or making decisions?: No Patient able to express need for assistance with ADLs?: No Does the patient have difficulty dressing or bathing?: No Independently performs ADLs?: Yes (appropriate for developmental age) Communication: Independent Dressing (OT): Independent Grooming: Independent Feeding: Independent Bathing: Independent Toileting: Independent In/Out Bed: Independent Walks in Home: Independent Does the patient have difficulty walking or climbing stairs?: No Weakness of Legs: None Weakness of Arms/Hands: None  Permission Sought/Granted Permission sought to share information with : Family Supports, Case Estate manager/land agentManager Permission granted to share information with : Yes, Verbal Permission Granted  Share Information with NAME: Mackie PaiRobin Bachmann  Permission granted to share info w AGENCY: [facility]  Permission granted to share info w Relationship: Daugheter, [facility] rep     Emotional Assessment Appearance:: Appears stated age Attitude/Demeanor/Rapport: Engaged Affect (typically observed): Accepting Orientation: : Oriented to Self, Oriented to Place, Oriented to  Time, Oriented to Situation   Psych Involvement: No (comment)  Admission diagnosis:  Altered mental status, unspecified altered mental status type [R41.82] Patient Active Problem List   Diagnosis Date Noted  . Acute encephalopathy 11/27/2018  . AKI (acute  kidney injury) ([place]) 11/27/2018  . Hyperlipemia 11/27/2018  . On anticoagulant  therapy 11/27/2018   PCP:  System, Pcp Not In Pharmacy:   [id number] [place], [place] [address] Phone: [phone] Fax: [phone]     Social Determinants of Health (SDOH) Interventions    Readmission Risk Interventions No flowsheet data found.

## 2018-11-29 NOTE — Progress Notes (Signed)
Home health agencies that serve [phone].        [place] Quality of Patient Care Rating Patient Survey Summary Rating  [place] [phone]) [phone] 2  out of 5 stars Not [place] [phone] Not Available4 Not [place] [phone] 3 out of 5 stars Not [place] [phone] 2 out of 5 stars Not [place] [phone] 5 out of 5 stars 4 out of [place] [phone] 1  out of 5 stars Not [place] [phone] 3  out of 5 stars 2 out of 5 stars  [place] [phone] Not Available4 Not [place] [phone] 3 out of 5 stars 4 out of [place] [phone] 3  out of 5 stars 4 out of [place] [phone] 3 out of 5 stars Not [place] [phone] 1  out of 5 stars Not [place] [phone] Not Available5 Not [place] [phone] 2 out of 5 stars Not [place] [phone] 3  out of 5 stars 3 out of [place] [phone] 3 out of 5 stars 3 out of [place] [phone] Not Available4 Not [place] [phone] 2 out of 5 stars 5 out of [place] [phone] 2 out of 5 stars Not [place] [phone] 1  out of 5 stars Not [place] [phone] 3 out of 5 stars Not [place] [phone] 1  out of 5 stars Not [place]  [phone] Not Available4 Not [place] [phone] 4  out of 5 stars 3 out of [place] [phone] 4 out of 5 stars 3 out of [place] [phone] Not Available4 Not [place] [phone] 3  out of 5 stars 4 out of 5 stars  [facility] [phone] 1  out of 5 stars Not [place] [phone] 2 out of 5 stars Not [facility] [phone] 4  out of 5 stars 4 out of [place] [phone] 2  out of 5 stars 3 out of [place] [phone] Not Available4 Not [place] [phone] 2 out of 5 stars Not [place] [phone] 2 out of 5 stars 3 out of 5 stars  [place] [phone] 1  out of 5 stars 4 out of [place] [phone] 2  out of 5 stars 4 out of 5 stars  [place] [phone] 4  out of 5 stars 3 out of [place] [phone] 2 out of 5 stars 5 out of [place] [phone] 3 out of 5 stars 2 out of [place] [phone] 3  out of 5 stars 3 out of [place] [phone] 4  out of 5 stars 4 out of 5 stars  [facility] [phone] 2  out of 5 stars 3 out of [place] number Footnote as displayed on [place]  1 This agency provides services under a federal waiver program to non-traditional, chronic long term population.  2 This agency provides services to a special needs population.  3 Not Available.  4 The number of patient episodes for this measure is too small to  report.  5 This measure currently does not have data or provider has been certified/recertified for less than 6 months.  6 The national average for this measure is not provided because of state-to-state differences in data collection.  7 Medicare is not displaying rates for this measure for any home health agency, because of an issue with the data.  8 There were problems with the data and they are being corrected.  9 Zero, or very few, patients met the survey's rules for inclusion. The scores shown, if any, reflect a very small number of surveys and may not accurately tell how an agency is doing.  10 Survey results are based on less than 12 months of data.  11 Fewer than 70 patients completed the survey. Use the scores shown, if any, with caution as the number of surveys may be too low to accurately tell how an agency is doing.  12 No survey results are available for this period.  13 Data suppressed by CMS for one or more quarters.

## 2018-11-29 NOTE — Discharge Summary (Addendum)
Physician Discharge Summary  Michelle Walton IHK:[id number] DOB: 1935-04-08 DOA: 11/27/2018  PCP: System, Pcp Not In  Admit date: 11/27/2018 Discharge date: 11/29/2018  Admitted From: Home Disposition:  Home  Recommendations for Outpatient Follow-up:  1. Follow up with PCP in 1 week 2. Please obtain BMP in 1 week  3. Recommend home health PT and RN (patient resides in [place] and will be returning back home in next 1-2 days). Orders placed/CM aware.   Discharge Condition: Stable CODE STATUS: Full  Diet recommendation: Heart healthy   Brief/Interim Summary: Michelle Walton a 84 y.o.femalewith medical history significant ofprevious other nonhemorrhagic stroke, HTN, HLD who is visiting [place] from [place] for the past 2-3 weeks. She presents to the hospital with concern for altered mental status. Apparently per report,daughterfelt that patient was acting differently from her baseline. Patient has some history of dementia and short-term memory loss at baseline but has been more confused in the past 2 days. Per daughter, patient's baseline is that she is forgetful with her words and vocabulary. Now, she is not really aware of her surroundings, hallucinating, forgetting simple tasks, appears to be foggy.Patient does not have any complaints on examination. She denies any fevers, cough, shortness of breath, chest pain, abdominal pain, nausea, vomiting or diarrhea. She is somewhat confused, not oriented to place but could be that she was disoriented as she is not from this area. She is oriented to year and herself. She states that she has fallen recently as well. Labs obtained creatinine 2.2,INR 2.5.CT head without contrast revealed moderate diffuse atrophy, no acute infarct, although there was some increased attenuation in the left middle cerebral artery compared to the right with concern for potential early change in the acute MCA distribution infarct on the left. Case discussed with  neurology who recommended MRI for further evaluation. MRI brain negative for acute infarct.   Discharge Diagnoses:   Delirium with vascular dementia -CT head revealed increased attenuation in left MCA compared to the right. MRI brain negative for acute infarct -Urine culture negative. Stop antibiotics  -Patient does have some dementia and short-term memory loss at baseline; she does exhibit signs of dementia in the hospital with intermittent confusion but redirectable, most likely vascular dementia with worsening mentation/delirium due to change in her routine and environment in setting of COVID pandemic  -PT OT recommending home health PT   AKI on CKD 3 -No previous blood work on file, eGFR in 37 in Nov 2019 per family member  -Presentingcreatinine 2.2 -Creatinine improved to 1.57, resume accupril   Hyperlipidemia -Continue Crestor  On anticoagulation, ?PAF -On coumadin, unclear why she is on this, family thinks it was started after a strokeand patient has been on this for many years -Resume coumadin 52m daily  -INR 2.1 today    Discharge Instructions  Discharge Instructions    Call MD for:  difficulty breathing, headache or visual disturbances   Complete by:  As directed    Call MD for:  extreme fatigue   Complete by:  As directed    Call MD for:  persistant dizziness or light-headedness   Complete by:  As directed    Call MD for:  persistant nausea and vomiting   Complete by:  As directed    Call MD for:  severe uncontrolled pain   Complete by:  As directed    Call MD for:  temperature >100.4   Complete by:  As directed    Diet - low sodium heart healthy   Complete  by:  As directed    Discharge instructions   Complete by:  As directed    You were cared for by a hospitalist during your hospital stay. If you have any questions about your discharge medications or the care you received while you were in the hospital after you are discharged, you can call the unit and  ask to speak with the hospitalist on call if the hospitalist that took care of you is not available. Once you are discharged, your primary care physician will handle any further medical issues. Please note that NO REFILLS for any discharge medications will be authorized once you are discharged, as it is imperative that you return to your primary care physician (or establish a relationship with a primary care physician if you do not have one) for your aftercare needs so that they can reassess your need for medications and monitor your lab values.   Increase activity slowly   Complete by:  As directed      Allergies as of 11/29/2018   No Known Allergies     Medication List    TAKE these medications   Fish Oil 1000 MG Caps Take 1,000 mg by mouth daily.   quinapril 10 MG tablet Commonly known as:  ACCUPRIL Take 10 mg by mouth daily.   rosuvastatin 40 MG tablet Commonly known as:  CRESTOR Take 40 mg by mouth daily.   warfarin 3 MG tablet Commonly known as:  COUMADIN Take 3 mg by mouth daily.      Follow-up Information    Your PCP in [place] Follow up.          No Known Allergies  Consultations:  None    Procedures/Studies: Ct Head Wo Contrast  Addendum Date: 11/27/2018   ADDENDUM REPORT: 11/27/2018 14:07 ADDENDUM: These results were called by telephone at the time of interpretation on 11/27/2018 at 2:07 pm to Dr. Lacretia Walton , who verbally acknowledged these results. Electronically Signed   By: Lowella Grip III M.D.   On: 11/27/2018 14:07   Result Date: 11/27/2018 CLINICAL DATA:  Confusion with hallucinations EXAM: CT HEAD WITHOUT CONTRAST TECHNIQUE: Contiguous axial images were obtained from the base of the skull through the vertex without intravenous contrast. COMPARISON:  None. FINDINGS: Brain: There is moderate diffuse atrophy. There is no intracranial mass, hemorrhage, extra-axial fluid collection, or midline shift. There is mild calcification along the anterior  falx, benign in appearance. There is evidence of a prior mid left frontal infarct. There is extensive small vessel disease throughout the centra semiovale bilaterally. No acute infarct is demonstrable on this study. Vascular: There is increased attenuation in the left middle cerebral artery compared to its counterpart on the right. This finding may be indicative of the earliest changes of hyperdense vessel. Elsewhere there is extensive calcification in the distal vertebral arteries and carotid siphon regions bilaterally. Skull: The bony calvarium appears intact. Sinuses/Orbits: There is mucosal thickening in several ethmoid air cells. Other visualized paranasal sinuses are clear. Visualized orbits appear symmetric bilaterally. Other: Mastoid air cells are clear. IMPRESSION: Moderate diffuse atrophy with extensive periventricular small vessel disease bilaterally. Prior infarct mid left frontal lobe. No acute infarct is evident in the brain parenchyma. Note that there is increased attenuation in the left middle cerebral artery compared to the right. This is a finding that raises concern for potential early changes of acute middle cerebral artery distribution infarct on the left. Elsewhere, there is extensive arterial vascular calcification in the distal vertebral artery and  carotid siphon regions bilaterally. There is mucosal thickening in several ethmoid air cells. Electronically Signed: By: Lowella Grip III M.D. On: 11/27/2018 13:50   Mr Brain Wo Contrast  Result Date: 11/28/2018 CLINICAL DATA:  Confusion with hallucinations.  Question TIA. EXAM: MRI HEAD WITHOUT CONTRAST TECHNIQUE: Multiplanar, multiecho pulse sequences of the brain and surrounding structures were obtained without intravenous contrast. COMPARISON:  CT head 11/27/2018 FINDINGS: Brain: No evidence for acute infarction, hemorrhage, mass lesion, or extra-axial fluid. Generalized atrophy. Hydrocephalus ex vacuo. Extensive chronic microvascular  ischemic change of the periventricular and subcortical white matter. Large area of chronic infarction, LEFT frontal lobe, encephalomalacia and regional gliosis. Vascular: Normal flow voids. Skull and upper cervical spine: Normal marrow signal. Partial empty sella. Sinuses/Orbits: Negative. Other: None. 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 No acute intracranial findings. Similar appearance to earlier CT. Electronically Signed   By: Staci Righter M.D.   On: 11/28/2018 12:10      Discharge Exam: Vitals:   11/28/18 2006 11/29/18 0634  BP: (!) 153/78 (!) 163/82  Pulse: 75 99  Resp: 16 14  Temp: 98.9 F (37.2 C) 98.7 F (37.1 C)  SpO2: 100% 99%    General: Pt is alert, awake, not in acute distress Cardiovascular: RRR, S1/S2 +, no rubs, no gallops Respiratory: CTA bilaterally, no wheezing, no rhonchi Abdominal: Soft, NT, ND, bowel sounds + Extremities: no edema, no cyanosis    The results of significant diagnostics from this hospitalization (including imaging, microbiology, ancillary and laboratory) are listed below for reference.     Microbiology: Recent Results (from the past 240 hour(s))  Urine Culture     Status: None   Collection Time: 11/27/18  1:53 PM  Result Value Ref Range Status   Specimen Description   Final    URINE, RANDOM Performed at [place] [address], [place], [place] [address]    Special Requests   Final    NONE Performed at [facility], [place] [address] [place], [place] [address]    Culture   Final    NO GROWTH Performed at [facility], [place] [address], [place], [place] [address]    Report Status 11/28/2018 FINAL  Final  SARS Coronavirus 2 (CEPHEID - Performed in [place] hospital lab), Hosp Order     Status: None   Collection Time: 11/27/18  3:40 PM  Result Value Ref Range Status   SARS Coronavirus 2 NEGATIVE NEGATIVE Final    Comment: (NOTE) If result is  NEGATIVE SARS-CoV-2 target nucleic acids are NOT DETECTED. The SARS-CoV-2 RNA is generally detectable in upper and lower  respiratory specimens during the acute phase of infection. The lowest  concentration of SARS-CoV-2 viral copies this assay can detect is 250  copies / mL. A negative result does not preclude SARS-CoV-2 infection  and should not be used as the sole basis for treatment or other  patient management decisions.  A negative result may occur with  improper specimen collection / handling, submission of specimen other  than nasopharyngeal swab, presence of viral mutation(s) within the  areas targeted by this assay, and inadequate number of viral copies  (<250 copies / mL). A negative result must be combined with clinical  observations, patient history, and epidemiological information. If result is POSITIVE SARS-CoV-2 target nucleic acids are DETECTED. The SARS-CoV-2 RNA is generally detectable in upper and lower  respiratory specimens dur ing the acute phase of infection.  Positive  results are indicative of active infection with SARS-CoV-2.  Clinical  correlation with patient history and other diagnostic information is  necessary to determine patient infection status.  Positive results do  not rule out bacterial infection or co-infection with other viruses. If result is PRESUMPTIVE POSTIVE SARS-CoV-2 nucleic acids MAY BE PRESENT.   A presumptive positive result was obtained on the submitted specimen  and confirmed on repeat testing.  While 2019 novel coronavirus  (SARS-CoV-2) nucleic acids may be present in the submitted sample  additional confirmatory testing may be necessary for epidemiological  and / or clinical management purposes  to differentiate between  SARS-CoV-2 and other Sarbecovirus currently known to infect humans.  If clinically indicated additional testing with an alternate test  methodology [phone]) is advised. The SARS-CoV-2 RNA is generally  detectable  in upper and lower respiratory sp ecimens during the acute  phase of infection. The expected result is Negative. Fact Sheet for Patients:  StrictlyIdeas.no Fact Sheet for Healthcare Providers: BankingDealers.co.za This test is not yet approved or cleared by the [place] FDA and has been authorized for detection and/or diagnosis of SARS-CoV-2 by FDA under an Emergency Use Authorization (EUA).  This EUA will remain in effect (meaning this test can be used) for the duration of the COVID-19 declaration under Section 564(b)(1) of the Act, 21 U.S.C. section 360bbb-3(b)(1), unless the authorization is terminated or revoked sooner. Performed at [facility], [place] [address] [place], [place] [address]      Labs: BNP (last 3 results) No results for input(s): BNP in the last 8760 hours. Basic Metabolic Panel: Recent Labs  Lab 11/27/18 1404 11/28/18 0554 11/29/18 0548  NA 143 143 142  K 3.5 3.3* 3.9  CL 113* 118* 118*  CO2 22 21* 17*  GLUCOSE 103* 77 86  BUN 32* 26* 17  CREATININE 2.20* 1.91* 1.57*  CALCIUM 9.0 8.4* 8.4*   Liver Function Tests: Recent Labs  Lab 11/27/18 1404  AST 44*  ALT 33  ALKPHOS 76  BILITOT 0.6  PROT 8.1  ALBUMIN 4.0   No results for input(s): LIPASE, AMYLASE in the last 168 hours. No results for input(s): AMMONIA in the last 168 hours. CBC: Recent Labs  Lab 11/27/18 1404 11/28/18 0554  WBC 7.1 6.8  HGB 12.1 10.6*  HCT 37.3 33.1*  MCV 97.9 96.2  PLT 213 187   Cardiac Enzymes: No results for input(s): CKTOTAL, CKMB, CKMBINDEX, TROPONINI in the last 168 hours. BNP: Invalid input(s): POCBNP CBG: Recent Labs  Lab 11/27/18 1257  GLUCAP 87   D-Dimer No results for input(s): DDIMER in the last 72 hours. Hgb A1c No results for input(s): HGBA1C in the last 72 hours. Lipid Profile No results for input(s): CHOL, HDL, LDLCALC, TRIG, CHOLHDL, LDLDIRECT in the last 72  hours. Thyroid function studies No results for input(s): TSH, T4TOTAL, T3FREE, THYROIDAB in the last 72 hours.  Invalid input(s): FREET3 Anemia work up No results for input(s): VITAMINB12, FOLATE, FERRITIN, TIBC, IRON, RETICCTPCT in the last 72 hours. Urinalysis    Component Value Date/Time   COLORURINE YELLOW 11/27/2018 1353   APPEARANCEUR HAZY (A) 11/27/2018 1353   LABSPEC 1.016 11/27/2018 1353   PHURINE 6.0 11/27/2018 1353   GLUCOSEU NEGATIVE 11/27/2018 1353   HGBUR MODERATE (A) 11/27/2018 1353   BILIRUBINUR NEGATIVE 11/27/2018 1353   KETONESUR NEGATIVE 11/27/2018 1353   PROTEINUR 100 (A) 11/27/2018 1353   NITRITE NEGATIVE 11/27/2018 1353   LEUKOCYTESUR MODERATE (A) 11/27/2018 1353   Sepsis Labs  Invalid input(s): PROCALCITONIN,  WBC,  LACTICIDVEN Microbiology Recent Results (from the past 240 hour(s))  Urine Culture     Status: None   Collection Time: 11/27/18  1:53 PM  Result Value Ref Range Status   Specimen Description   Final    URINE, RANDOM Performed at [place] [address] [place], [place] [address]    Special Requests   Final    NONE Performed at [facility], [place] [address], [place], [place] [address]    Culture   Final    NO GROWTH Performed at [facility], [place] [address], [place], [place] [address]    Report Status 11/28/2018 FINAL  Final  SARS Coronavirus 2 (CEPHEID - Performed in [place] hospital lab), Hosp Order     Status: None   Collection Time: 11/27/18  3:40 PM  Result Value Ref Range Status   SARS Coronavirus 2 NEGATIVE NEGATIVE Final    Comment: (NOTE) If result is NEGATIVE SARS-CoV-2 target nucleic acids are NOT DETECTED. The SARS-CoV-2 RNA is generally detectable in upper and lower  respiratory specimens during the acute phase of infection. The lowest  concentration of SARS-CoV-2 viral copies this assay can detect is 250  copies / mL. A negative result does not preclude SARS-CoV-2  infection  and should not be used as the sole basis for treatment or other  patient management decisions.  A negative result may occur with  improper specimen collection / handling, submission of specimen other  than nasopharyngeal swab, presence of viral mutation(s) within the  areas targeted by this assay, and inadequate number of viral copies  (<250 copies / mL). A negative result must be combined with clinical  observations, patient history, and epidemiological information. If result is POSITIVE SARS-CoV-2 target nucleic acids are DETECTED. The SARS-CoV-2 RNA is generally detectable in upper and lower  respiratory specimens dur ing the acute phase of infection.  Positive  results are indicative of active infection with SARS-CoV-2.  Clinical  correlation with patient history and other diagnostic information is  necessary to determine patient infection status.  Positive results do  not rule out bacterial infection or co-infection with other viruses. If result is PRESUMPTIVE POSTIVE SARS-CoV-2 nucleic acids MAY BE PRESENT.   A presumptive positive result was obtained on the submitted specimen  and confirmed on repeat testing.  While 2019 novel coronavirus  (SARS-CoV-2) nucleic acids may be present in the submitted sample  additional confirmatory testing may be necessary for epidemiological  and / or clinical management purposes  to differentiate between  SARS-CoV-2 and other Sarbecovirus currently known to infect humans.  If clinically indicated additional testing with an alternate test  methodology [id number]) is advised. The SARS-CoV-2 RNA is generally  detectable in upper and lower respiratory sp ecimens during the acute  phase of infection. The expected result is Negative. Fact Sheet for Patients:  StrictlyIdeas.no Fact Sheet for Healthcare Providers: BankingDealers.co.za This test is not yet approved or cleared by the [place]  FDA and has been authorized for detection and/or diagnosis of SARS-CoV-2 by FDA under an Emergency Use Authorization (EUA).  This EUA will remain in effect (meaning this test can be used) for the duration of the COVID-19 declaration under Section 564(b)(1) of the Act, 21 U.S.C. section 360bbb-3(b)(1), unless the authorization is terminated or revoked sooner. Performed at [facility], [place] [address], [place], [place] [address]      Patient was seen and examined on  the day of discharge and was found to be in stable condition. Time coordinating discharge: 35 minutes including assessment and coordination of care, as well as examination of the patient and discussion of care with family member over the phone.   SIGNED:  Dessa Phi, DO [facility] [email or web address] 11/29/2018, 9:58 AM

## 2018-11-29 NOTE — Progress Notes (Signed)
Occupational Therapy Treatment Patient Details Name: Michelle HackerGloria Walton MRN: [id number] DOB: 17-Mar-1935 Today's Date: 11/29/2018    History of present illness  84 y.o. female with medical history significant of previous stroke, HTN, HLD who is visiting [place] from [place] for the past 2[place]3 weeks. She presents to the hospital with concern for altered mental status.  Apparently per report, daughter felt that patient was acting differently from her baseline.  Patient has some history of dementia and short-term memory loss at baseline but has been more confused in the past 2 days   OT comments  Pt progressing towards OT goals. Pt continues to have cognitive deficits (baseline dementia? No family due to COVID) Pt very very sweet and pleasant throughout session, laughing and happy. Pt able to complete simple one step tasks but requires cues for multi-step tasks. Oriented to self and year/bday and knew she was in the hospital but other than that had to be told. Pt was able to perform toilet transfer, peri care, sink level grooming and ambulation at min guard assist level. Pt with LOB x2 with min A to correct. Agree with current POC. 24 hour supervision is essential.   Follow Up Recommendations  No OT follow up;Supervision/Assistance - 24 hour    Equipment Recommendations  None recommended by OT    Recommendations for Other Services      Precautions / Restrictions Precautions Precautions: Fall Restrictions Weight Bearing Restrictions: No       Mobility Bed Mobility Overal bed mobility: Modified Independent                Transfers Overall transfer level: Needs assistance Equipment used: 1 person hand held assist Transfers: Sit to/from Stand Sit to Stand: Min guard;Min assist         General transfer comment: close guard for safety, min A required x2 for balance    Balance Overall balance assessment: Needs assistance Sitting-balance support: No upper extremity supported;Feet  supported Sitting balance-Leahy Scale: Good Sitting balance - Comments: able to perform lateral leans for dressing LB   Standing balance support: No upper extremity supported Standing balance-Leahy Scale: Fair Standing balance comment: LOB x2 requiring min A to correct                           ADL either performed or assessed with clinical judgement   ADL Overall ADL's : Needs assistance/impaired Eating/Feeding: Set up;Sitting Eating/Feeding Details (indicate cue type and reason): Pt provided with hot tea, told it was very hot, she then proceeded to take a sip and say "Oh! that's hot!" lid left off and Pt attempted to drink again. Before she did OT said "It hasn't cooled off yet, give it more time" and Pt took a sip anyway stating again "Oh! That's hot!" sitter in room, and monitoring Grooming: Min guard;Standing;Wash/dry face;Oral care Grooming Details (indicate cue type and reason): at sink in bathroom             Lower Body Dressing: Set up;Sitting/lateral leans Lower Body Dressing Details (indicate cue type and reason): to don socks Toilet Transfer: Min guard;Ambulation Toilet Transfer Details (indicate cue type and reason): able to walk to bathroom with min guard and cues for safety with IV line management Toileting- Clothing Manipulation and Hygiene: Modified independent Toileting - Clothing Manipulation Details (indicate cue type and reason): with TP and warm wash cloth Tub/ Shower Transfer: Minimal assistance Tub/Shower Transfer Details (indicate cue type and reason): simulated with gargabe  can on side, Pt with LOB requiring min A. Educated on using wall for stability Functional mobility during ADLs: Min guard       Vision       Perception     Praxis      Cognition Arousal/Alertness: Awake/alert Behavior During Therapy: WFL for tasks assessed/performed Overall Cognitive Status: No family/caregiver present to determine baseline cognitive functioning                                  General Comments: Pt very pleasant throughout session, able to follow simple commands but requires cues with multi-step commands. Pt stated that she was from [place], visiting daughter, but otherwise unreliable historian        Exercises     Shoulder Instructions       General Comments      Pertinent Vitals/ Pain       Pain Assessment: No/denies pain  Home Living                                          Prior Functioning/Environment              Frequency  Min 2X/week        Progress Toward Goals  OT Goals(current goals can now be found in the care plan section)  Progress towards OT goals: Progressing toward goals  Acute Rehab OT Goals Patient Stated Goal: to go home soon OT Goal Formulation: With patient Time For Goal Achievement: 12/12/18 Potential to Achieve Goals: Good  Plan Discharge plan remains appropriate;Frequency remains appropriate    Co-evaluation                 AM-PAC OT "6 Clicks" Daily Activity     Outcome Measure   Help from another person eating meals?: None Help from another person taking care of personal grooming?: A Little Help from another person toileting, which includes using toliet, bedpan, or urinal?: None Help from another person bathing (including washing, rinsing, drying)?: A Little Help from another person to put on and taking off regular upper body clothing?: None Help from another person to put on and taking off regular lower body clothing?: None 6 Click Score: 22    End of Session Equipment Utilized During Treatment: Gait belt  OT Visit Diagnosis: Other symptoms and signs involving cognitive function;History of falling (Z91.81)   Activity Tolerance Patient tolerated treatment well   Patient Left in chair;with chair alarm set;with nursing/sitter in room;with call bell/phone within reach   Nurse Communication Mobility status        Time:  1829-9371 OT Time Calculation (min): 25 min  Charges: OT General Charges $OT Visit: 1 Visit OT Treatments $Self Care/Home Management : 23-37 mins  Hulda Humphrey OTR/L Acute Rehabilitation Services Pager: [phone] Office: [place] 11/29/2018, 10:26 AM

## 2020-05-21 IMAGING — MR MRI HEAD WITHOUT CONTRAST
9 of 10 series · 41 of 48 positions shown · non-contrast
Comparison: CT head 11/27/2018

CLINICAL DATA: Confusion with hallucinations.  Question TIA.

EXAM:
MRI HEAD WITHOUT CONTRAST
TECHNIQUE: Multiplanar, multiecho pulse sequences of the brain and surrounding
structures were obtained without intravenous contrast.

[Series 3: T1 · sagittal · 5.0mm · 0.47mm/px · 1 of 20 slices shown (1 of 2)]
[im 1/20]
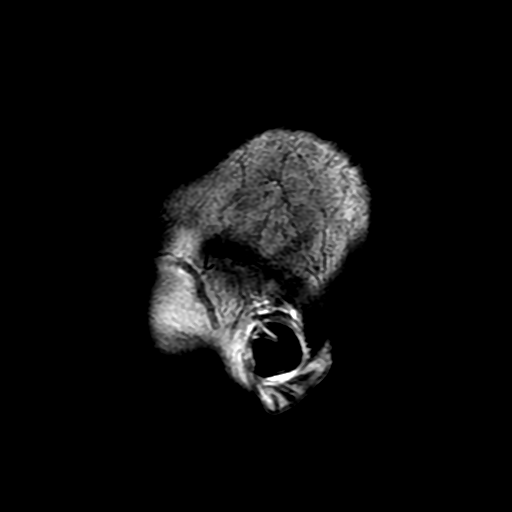

[Series 4: DWI · axial · 3.0mm · 1.09mm/px · z∈[-15,+118]mm · 9 of 92 slices shown (1 of 4)]
[im 1/92]
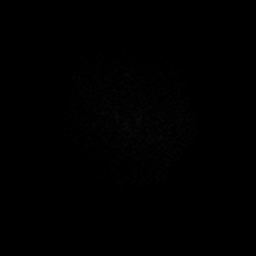
[im 12/92]
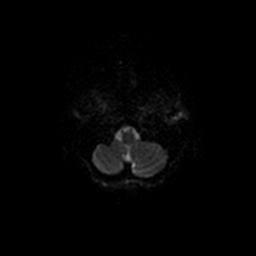
[im 23/92]
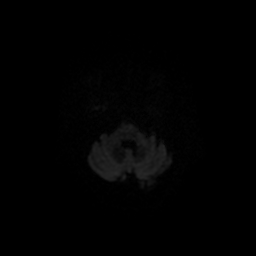
[im 35/92]
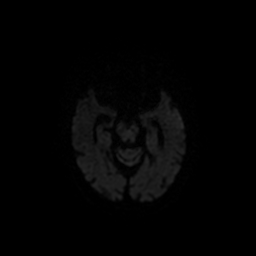
[im 46/92]
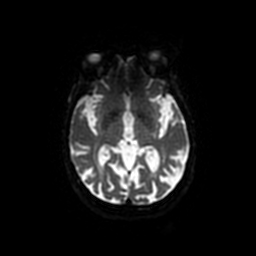
[im 57/92]
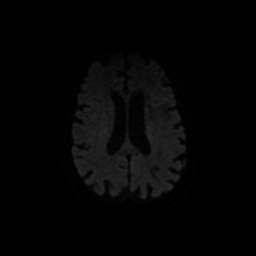
[im 69/92]
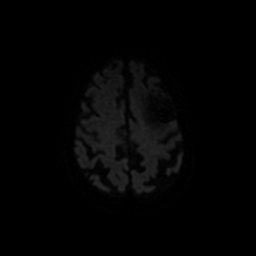
[im 80/92]
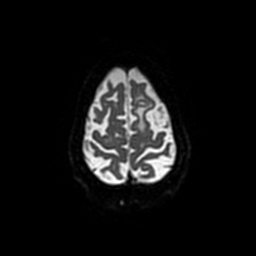
[im 92/92]
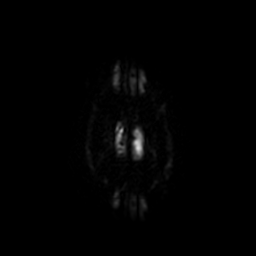

[Series 5: T2 · axial · 5.0mm · 0.86mm/px · z∈[-7,+123]mm · 2 of 23 slices shown (1 of 2)]
[im 1/23]
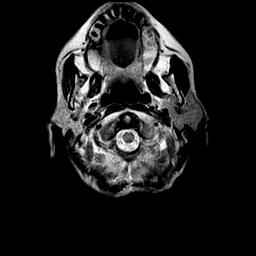
[im 23/23]
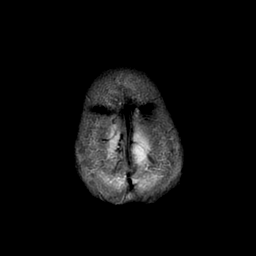

[Series 6: FLAIR · axial · 3.0mm · 0.86mm/px · z∈[-7,+123]mm · 2 of 23 slices shown]
[im 1/23]
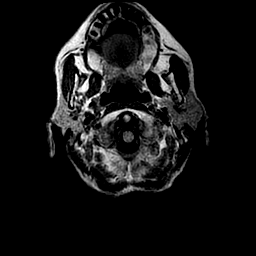
[im 23/23]
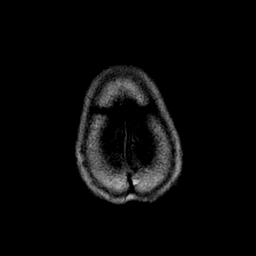

[Series 8: T1 · axial · 1.0mm · 0.47mm/px · z∈[-4,+123]mm · 8 of 130 slices shown (2 of 2)]
[im 1/130]
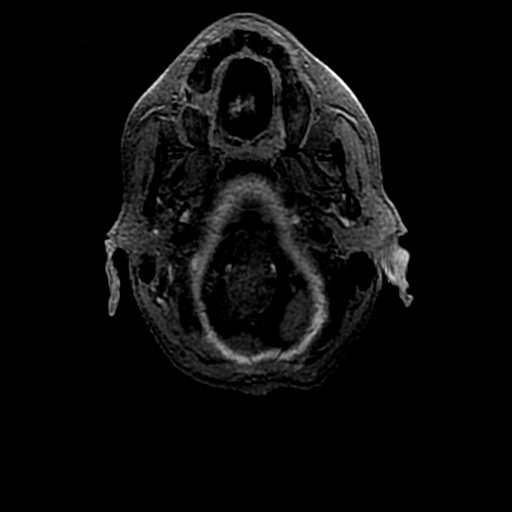
[im 24/130]
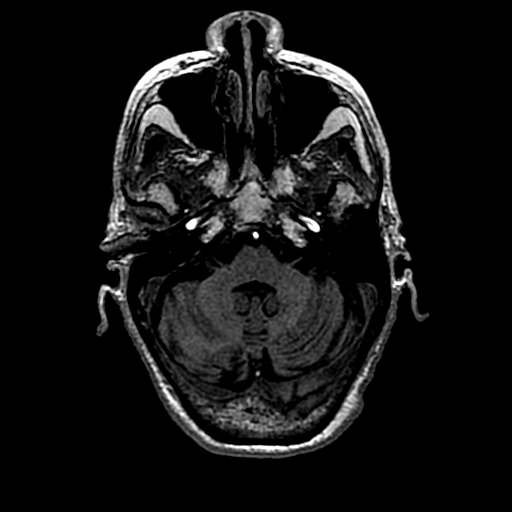
[im 36/130]
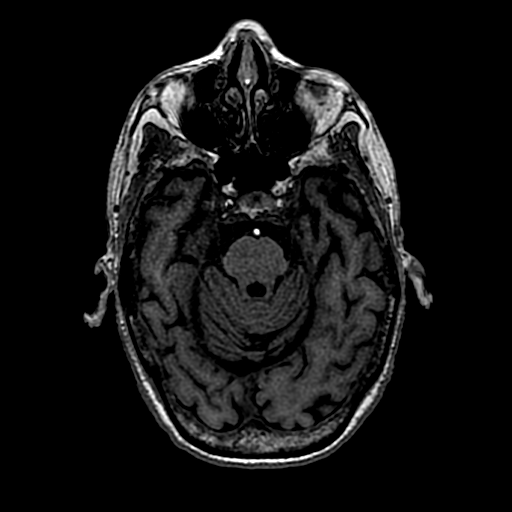
[im 59/130]
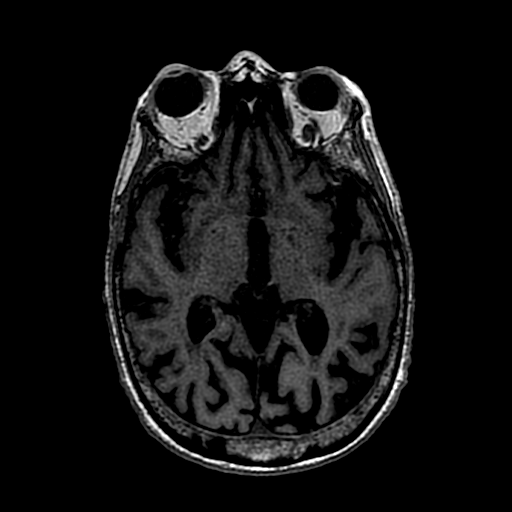
[im 71/130]
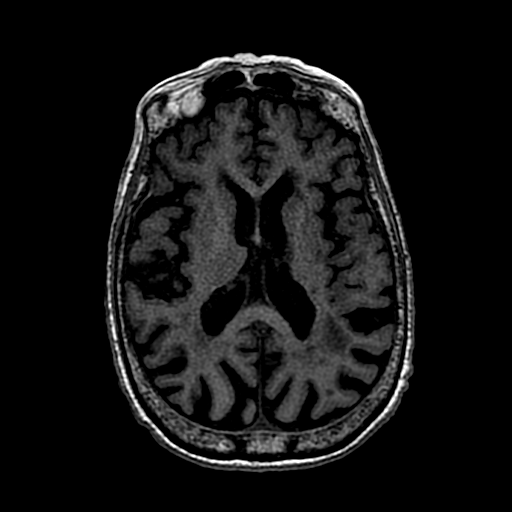
[im 94/130]
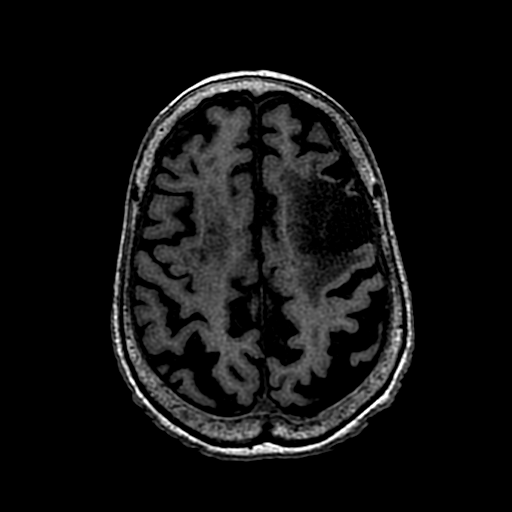
[im 106/130]
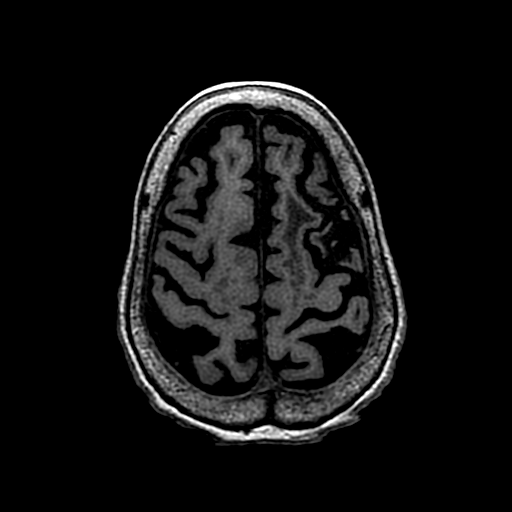
[im 130/130]
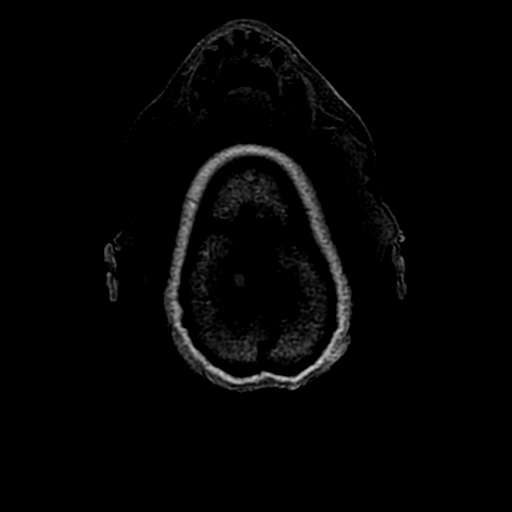

[Series 9: DWI · coronal · 4.0mm · 1.09mm/px · 8 of 84 slices shown (2 of 4)]
[im 1/84]
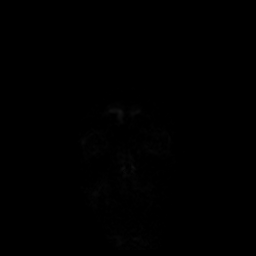
[im 12/84]
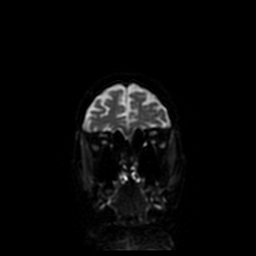
[im 24/84]
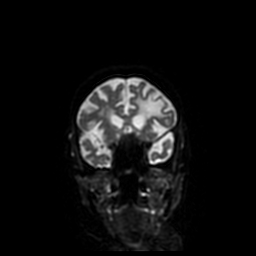
[im 36/84]
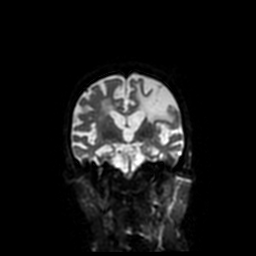
[im 48/84]
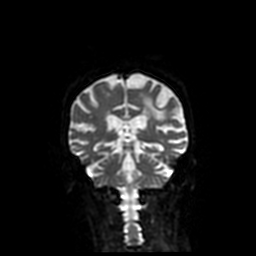
[im 60/84]
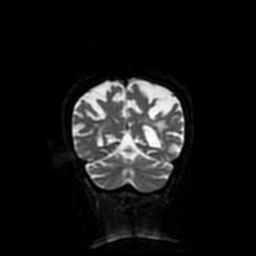
[im 72/84]
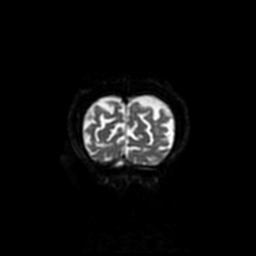
[im 84/84]
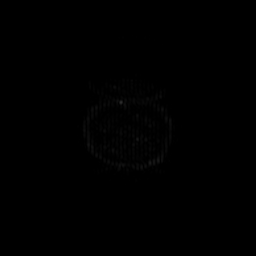

[Series 10: T2 · coronal · 5.0mm · 0.90mm/px · 3 of 27 slices shown (2 of 2)]
[im 1/27]
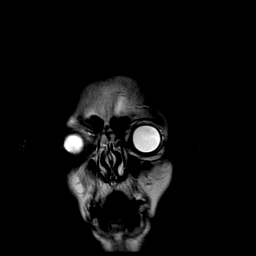
[im 14/27]
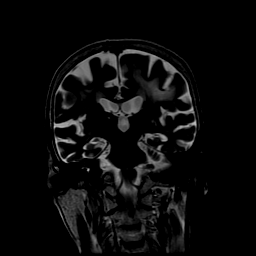
[im 27/27]
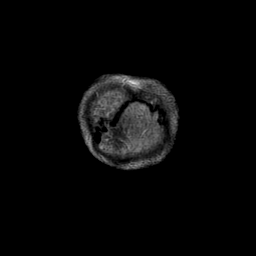

[Series 400: DWI · axial · 3.0mm · 1.09mm/px · z∈[-15,+118]mm · 4 of 46 slices shown (3 of 4)]
[im 1/46]
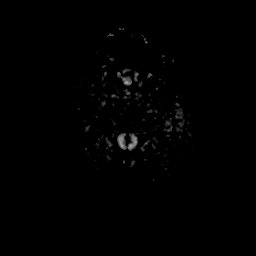
[im 16/46]
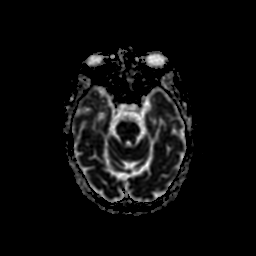
[im 31/46]
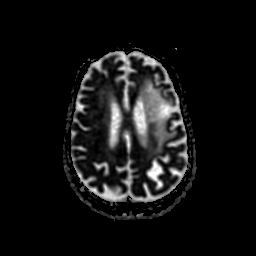
[im 46/46]
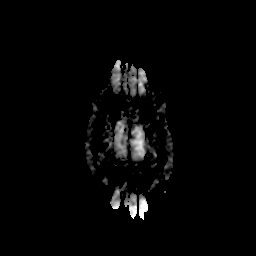

[Series 900: DWI · coronal · 4.0mm · 1.09mm/px · 4 of 42 slices shown (4 of 4)]
[im 1/42]
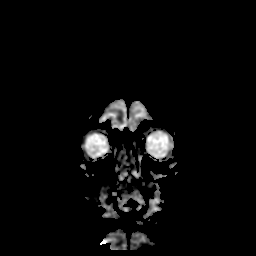
[im 14/42]
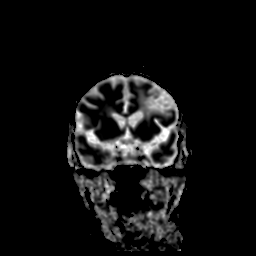
[im 28/42]
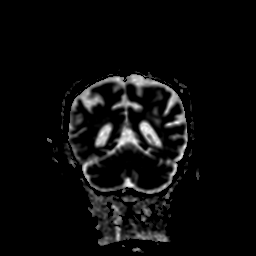
[im 42/42]
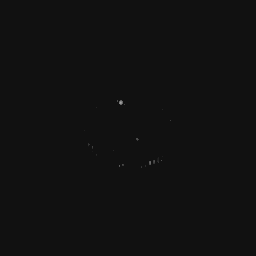

[41 of 48 positions shown; findings below may reference images not displayed]

FINDINGS: Brain: No evidence for acute infarction, hemorrhage, mass lesion, or
extra-axial fluid. Generalized atrophy. Hydrocephalus ex vacuo.
Extensive chronic microvascular ischemic change of the
periventricular and subcortical white matter. Large area of chronic
infarction, LEFT frontal lobe, encephalomalacia and regional
gliosis.

Vascular: Normal flow voids.

Skull and upper cervical spine: Normal marrow signal. Partial empty
sella.

Sinuses/Orbits: Negative.

Other: None.
IMPRESSION: Atrophy and small vessel disease. Old LEFT frontal infarct. No acute
intracranial findings. Similar appearance to earlier CT.

## 2021-10-22 DEATH — deceased
